# Patient Record
Sex: Male | Born: 1980
Health system: Southern US, Community
[De-identification: ages and names within clinical notes are randomized; demographics above are authoritative.]

## PROBLEM LIST (undated history)

## (undated) DIAGNOSIS — E162 Hypoglycemia, unspecified: Secondary | ICD-10-CM

## (undated) DIAGNOSIS — G8929 Other chronic pain: Secondary | ICD-10-CM

## (undated) HISTORY — PX: INTRAOCULAR LENS INSERTION: SHX110

## (undated) HISTORY — PX: RETINAL DETACHMENT SURGERY: SHX105

## (undated) HISTORY — PX: HERNIA REPAIR: SHX51

## (undated) HISTORY — PX: KNEE SURGERY: SHX244

---
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
## 2005-05-23 ENCOUNTER — Emergency Department (HOSPITAL_COMMUNITY): Admission: EM | Admit: 2005-05-23 | Discharge: 2005-05-23 | Payer: Self-pay | Admitting: Emergency Medicine

## 2005-05-24 ENCOUNTER — Inpatient Hospital Stay (HOSPITAL_COMMUNITY): Admission: EM | Admit: 2005-05-24 | Discharge: 2005-05-25 | Payer: Self-pay | Admitting: Emergency Medicine

## 2005-08-26 ENCOUNTER — Emergency Department (HOSPITAL_COMMUNITY): Admission: EM | Admit: 2005-08-26 | Discharge: 2005-08-26 | Payer: Self-pay | Admitting: Emergency Medicine

## 2006-02-08 ENCOUNTER — Emergency Department: Payer: Self-pay | Admitting: Emergency Medicine

## 2006-12-23 ENCOUNTER — Emergency Department (HOSPITAL_COMMUNITY): Admission: EM | Admit: 2006-12-23 | Discharge: 2006-12-23 | Payer: Self-pay | Admitting: Emergency Medicine

## 2007-04-05 ENCOUNTER — Emergency Department (HOSPITAL_COMMUNITY): Admission: EM | Admit: 2007-04-05 | Discharge: 2007-04-05 | Payer: Self-pay | Admitting: Emergency Medicine

## 2009-03-01 ENCOUNTER — Emergency Department (HOSPITAL_COMMUNITY): Admission: EM | Admit: 2009-03-01 | Discharge: 2009-03-02 | Payer: Self-pay | Admitting: Emergency Medicine

## 2009-05-05 ENCOUNTER — Emergency Department (HOSPITAL_COMMUNITY): Admission: EM | Admit: 2009-05-05 | Discharge: 2009-05-05 | Payer: Self-pay | Admitting: Emergency Medicine

## 2009-05-06 ENCOUNTER — Emergency Department (HOSPITAL_COMMUNITY): Admission: EM | Admit: 2009-05-06 | Discharge: 2009-05-06 | Payer: Self-pay | Admitting: Emergency Medicine

## 2009-05-06 ENCOUNTER — Emergency Department (HOSPITAL_COMMUNITY): Admission: EM | Admit: 2009-05-06 | Discharge: 2009-05-07 | Payer: Self-pay | Admitting: Emergency Medicine

## 2009-06-09 ENCOUNTER — Telehealth: Payer: Self-pay | Admitting: Internal Medicine

## 2010-02-09 NOTE — Progress Notes (Signed)
Summary: nos appt  Phone Note Call from Patient   Caller: [email or web address]  Call For: Cheng Dec Summary of Call: ATC to call pt to rsc nos from 5/27, phone numbers listed don't belong to him. Initial call taken by: Darletta Moll,  Jun 09, 2009 9:37 AM
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
## 2010-06-08 ENCOUNTER — Emergency Department (HOSPITAL_COMMUNITY): Payer: No Typology Code available for payment source

## 2010-06-08 ENCOUNTER — Emergency Department (HOSPITAL_COMMUNITY)
Admission: EM | Admit: 2010-06-08 | Discharge: 2010-06-08 | Disposition: A | Discharge status: Home / Self Care | Payer: No Typology Code available for payment source | Attending: Emergency Medicine | Admitting: Emergency Medicine

## 2010-06-08 DIAGNOSIS — R51 Headache: Secondary | ICD-10-CM | POA: Insufficient documentation

## 2010-06-08 DIAGNOSIS — R1012 Left upper quadrant pain: Secondary | ICD-10-CM | POA: Insufficient documentation

## 2010-06-08 DIAGNOSIS — M542 Cervicalgia: Secondary | ICD-10-CM | POA: Insufficient documentation

## 2010-06-08 DIAGNOSIS — M545 Low back pain, unspecified: Secondary | ICD-10-CM | POA: Insufficient documentation

## 2010-06-08 DIAGNOSIS — R413 Other amnesia: Secondary | ICD-10-CM | POA: Insufficient documentation

## 2010-06-08 DIAGNOSIS — M25529 Pain in unspecified elbow: Secondary | ICD-10-CM | POA: Insufficient documentation

## 2010-06-08 DIAGNOSIS — F29 Unspecified psychosis not due to a substance or known physiological condition: Secondary | ICD-10-CM | POA: Insufficient documentation

## 2010-06-08 DIAGNOSIS — M25569 Pain in unspecified knee: Secondary | ICD-10-CM | POA: Insufficient documentation

## 2010-06-08 LAB — POCT I-STAT, CHEM 8
Calcium, Ion: 1.2 mmol/L (ref 1.12–1.32)
Chloride: 103 mEq/L (ref 96–112)
Creatinine, Ser: 1.1 mg/dL (ref 0.4–1.5)
Potassium: 3.7 mEq/L (ref 3.5–5.1)
TCO2: 24 mmol/L (ref 0–100)

## 2010-06-08 MED ORDER — IOHEXOL 300 MG/ML  SOLN
100.0000 mL | Freq: Once | INTRAMUSCULAR | Status: AC | PRN
  Administered 2010-06-08: 100 mL via INTRAVENOUS

## 2010-07-01 ENCOUNTER — Emergency Department (HOSPITAL_COMMUNITY)
Admission: EM | Admit: 2010-07-01 | Discharge: 2010-07-01 | Discharge status: Against Medical Advice | Payer: Self-pay | Attending: Emergency Medicine | Admitting: Emergency Medicine

## 2010-07-01 ENCOUNTER — Emergency Department (HOSPITAL_COMMUNITY)
Admission: EM | Admit: 2010-07-01 | Discharge: 2010-07-01 | Disposition: A | Discharge status: Home / Self Care | Payer: No Typology Code available for payment source | Attending: Emergency Medicine | Admitting: Emergency Medicine

## 2010-07-01 DIAGNOSIS — S0990XA Unspecified injury of head, initial encounter: Secondary | ICD-10-CM | POA: Insufficient documentation

## 2010-07-01 DIAGNOSIS — Z049 Encounter for examination and observation for unspecified reason: Secondary | ICD-10-CM | POA: Insufficient documentation

## 2010-07-01 DIAGNOSIS — Z0389 Encounter for observation for other suspected diseases and conditions ruled out: Secondary | ICD-10-CM | POA: Insufficient documentation

## 2010-07-01 DIAGNOSIS — R51 Headache: Secondary | ICD-10-CM | POA: Insufficient documentation

## 2011-11-08 ENCOUNTER — Encounter (HOSPITAL_COMMUNITY): Payer: Self-pay | Admitting: *Deleted

## 2011-11-08 ENCOUNTER — Emergency Department (HOSPITAL_COMMUNITY)
Admission: EM | Admit: 2011-11-08 | Discharge: 2011-11-08 | Disposition: A | Discharge status: Home / Self Care | Payer: Self-pay | Attending: Emergency Medicine | Admitting: Emergency Medicine

## 2011-11-08 DIAGNOSIS — F17200 Nicotine dependence, unspecified, uncomplicated: Secondary | ICD-10-CM | POA: Insufficient documentation

## 2011-11-08 DIAGNOSIS — L0390 Cellulitis, unspecified: Secondary | ICD-10-CM

## 2011-11-08 DIAGNOSIS — L02219 Cutaneous abscess of trunk, unspecified: Secondary | ICD-10-CM | POA: Insufficient documentation

## 2011-11-08 MED ORDER — ACETAMINOPHEN-CODEINE #3 300-30 MG PO TABS: 1.0000 | ORAL_TABLET | Freq: Four times a day (QID) | ORAL | Status: DC | PRN

## 2011-11-08 MED ORDER — CLINDAMYCIN HCL 300 MG PO CAPS: 300.0000 mg | ORAL_CAPSULE | Freq: Four times a day (QID) | ORAL | Status: DC

## 2011-11-08 NOTE — ED Notes (Signed)
PA at bedside.

## 2011-11-08 NOTE — ED Notes (Signed)
Pt states went to [facility] on Saturday and had an abscess on abdomen drained, they told him if it didn't get any better then to come back to the hospital, states was given vicodin and antibiotic but has not helped. Pt states still very painful. Pt states has been breaking out in hives from the antibiotic.

## 2011-11-08 NOTE — ED Provider Notes (Signed)
History   This chart was scribed for non-physician practitioner working with Lyanne Co, MD by Greer Ee. This patient was seen in room WTR5/WTR5 and the patient's care was started at 16:34.    CSN: [id number]  Arrival date & time 11/08/11  1427   First MD Initiated Contact with Patient 11/08/11 1615      Chief Complaint  Patient presents with  . Abscess    (Consider location/radiation/quality/duration/timing/severity/associated sxs/prior treatment) The history is provided by the patient. No language interpreter was used.   Robert Quinn is a 31 y.o. male who presents to the Emergency Department complaining of a painful abscess that was drained 2 days ago at [facility] and per pt has gotten larger and been draining since it was drained.  Pain not improved by vicodin.  Pt reports he has been taking prescribed unspecified antibiotic on schedule, but that they have been causing hives resolved by benadryl.  Pt denies fever, chills, nausea, emesis, and abdominal pain as associated.  Pt has no h/o chronic medical conditions.  Pt is a current everyday smoker but denies alcohol use.  History reviewed. No pertinent past medical history.  Past Surgical History  Procedure Date  . Knee surgery     History reviewed. No pertinent family history.  History  Substance Use Topics  . Smoking status: Current Every Day Smoker  . Smokeless tobacco: Never Used  . Alcohol Use: No      Review of Systems  All other systems reviewed and are negative.    Allergies  Review of patient's allergies indicates no known allergies.  Home Medications   Current Outpatient Rx  Name Route Sig Dispense Refill  . CIPROFLOXACIN HCL 250 MG PO TABS Oral Take 250 mg by mouth 2 (two) times daily.    Marland Kitchen DIPHENHYDRAMINE HCL 25 MG PO TABS Oral Take 25 mg by mouth every 6 (six) hours as needed. Hives    . HYDROCODONE-ACETAMINOPHEN 5-500 MG PO CAPS Oral Take 1 capsule by mouth every 6 (six) hours as  needed. Pain      BP 123/56  Pulse 80  Temp 98.2 F (36.8 C) (Oral)  Resp 18  SpO2 100%  Physical Exam  Nursing note and vitals reviewed. Constitutional: He is oriented to person, place, and time. He appears well-developed and well-nourished.  HENT:  Head: Normocephalic and atraumatic.  Eyes: EOM are normal.  Neck: Normal range of motion. No tracheal deviation present.  Cardiovascular: Normal rate, regular rhythm and normal heart sounds.   No murmur heard. Pulmonary/Chest: Effort normal and breath sounds normal. He has no wheezes.  Musculoskeletal: Normal range of motion. He exhibits no edema.  Neurological: He is alert and oriented to person, place, and time.  Skin: Skin is warm.       1 inch indurated non-draining abscess with surrounding erythema and edema, very tender to palpation.  Psychiatric: He has a normal mood and affect.    ED Course  Procedures (including critical care time) INCISION AND DRAINAGE Performed by: Johnnette Gourd Consent: Verbal consent obtained. Risks and benefits: risks, benefits and alternatives were discussed Type: abscess  Body area: left suprapubic region  Anesthesia: local infiltration  Local anesthetic: lidocaine 2% with epinephrine  Anesthetic total: 5 ml  Complexity: complex Blunt dissection to break up loculations  Drainage: purulent  Drainage amount: large  Packing material: none  Patient tolerance: Patient tolerated the procedure well with no immediate complications.    DIAGNOSTIC STUDIES: Oxygen Saturation is 100% on room  air, normal by my interpretation.    COORDINATION OF CARE: 16:39- Patient informed of clinical course, understands medical decision-making process, and agrees with plan.  Discussed I&D.      Labs Reviewed - No data to display No results found.   1. Abscess and cellulitis       MDM  Abscess drained expressing purulent pus. There is surrounding erythema and edema. Patient believes the  antibiotic he was placed on with Bactrim. I will switch him to clindamycin. No packing needed. Close return precautions discussed.  I personally performed the services described in this documentation, which was scribed in my presence. The recorded information has been reviewed and considered. No att. providers found        Trevor Mace, PA-C 11/08/11 1734

## 2011-11-09 NOTE — ED Provider Notes (Signed)
Medical screening examination/treatment/procedure(s) were performed by non-physician practitioner and as supervising physician I was immediately available for consultation/collaboration.   Shane Melby M Ladina Shutters, MD 11/09/11 0018 

## 2012-07-21 ENCOUNTER — Emergency Department (HOSPITAL_COMMUNITY): Payer: Self-pay

## 2012-07-21 ENCOUNTER — Emergency Department (HOSPITAL_COMMUNITY)
Admission: EM | Admit: 2012-07-21 | Discharge: 2012-07-21 | Disposition: A | Discharge status: Home / Self Care | Payer: Self-pay | Attending: Emergency Medicine | Admitting: Emergency Medicine

## 2012-07-21 DIAGNOSIS — F17200 Nicotine dependence, unspecified, uncomplicated: Secondary | ICD-10-CM | POA: Insufficient documentation

## 2012-07-21 DIAGNOSIS — R0789 Other chest pain: Secondary | ICD-10-CM | POA: Insufficient documentation

## 2012-07-21 DIAGNOSIS — R062 Wheezing: Secondary | ICD-10-CM | POA: Insufficient documentation

## 2012-07-21 DIAGNOSIS — R079 Chest pain, unspecified: Secondary | ICD-10-CM

## 2012-07-21 DIAGNOSIS — R42 Dizziness and giddiness: Secondary | ICD-10-CM | POA: Insufficient documentation

## 2012-07-21 DIAGNOSIS — R55 Syncope and collapse: Secondary | ICD-10-CM | POA: Insufficient documentation

## 2012-07-21 LAB — POCT I-STAT TROPONIN I: Troponin i, poc: 0 ng/mL (ref 0.00–0.08)

## 2012-07-21 LAB — PRO B NATRIURETIC PEPTIDE: Pro B Natriuretic peptide (BNP): 43.7 pg/mL (ref 0–125)

## 2012-07-21 LAB — BASIC METABOLIC PANEL
Calcium: 9.4 mg/dL (ref 8.4–10.5)
Creatinine, Ser: 1.02 mg/dL (ref 0.50–1.35)
GFR calc non Af Amer: >90 mL/min (ref >90)
Glucose, Bld: 102 mg/dL — ABNORMAL HIGH (ref 70–99)
Sodium: 135 mEq/L (ref 135–145)

## 2012-07-21 LAB — CBC
MCH: 32.2 pg (ref 26.0–34.0)
MCHC: 35.8 g/dL (ref 30.0–36.0)
Platelets: 246 10*3/uL (ref 150–400)

## 2012-07-21 MED ORDER — ALBUTEROL SULFATE HFA 108 (90 BASE) MCG/ACT IN AERS
2.0000 | INHALATION_SPRAY | Freq: Once | RESPIRATORY_TRACT | Status: AC
  Administered 2012-07-21: 2 via RESPIRATORY_TRACT
  Filled 2012-07-21: qty 6.7

## 2012-07-21 MED ORDER — PREDNISONE 20 MG PO TABS
60.0000 mg | ORAL_TABLET | Freq: Once | ORAL | Status: AC
  Administered 2012-07-21: 60 mg via ORAL
  Filled 2012-07-21: qty 3

## 2012-07-21 MED ORDER — AEROCHAMBER PLUS W/MASK MISC
Status: AC
  Administered 2012-07-21: 23:00:00
  Filled 2012-07-21: qty 1

## 2012-07-21 MED ORDER — PREDNISONE 20 MG PO TABS: ORAL_TABLET | ORAL | Status: DC

## 2012-07-21 MED ORDER — IBUPROFEN 600 MG PO TABS: 600.0000 mg | ORAL_TABLET | Freq: Four times a day (QID) | ORAL | Status: DC | PRN

## 2012-07-21 NOTE — ED Notes (Signed)
Patient to xray.

## 2012-07-21 NOTE — ED Provider Notes (Signed)
I evaluated the patient in conjunction with the resident physician.  I saw the relevant studies, including the ecg (as needed) and agree with the interpretation. The documentation is accurate with the following additional / clarifications:  Darivs Lunden, MD 07/21/12 2337 

## 2012-07-21 NOTE — ED Notes (Signed)
Pt c/o Substernal and right sided CP described as sharp pain, with lightheadedness, dizziness, SOB, weakness, diaphoresis, and nausea. Denies vomiting. Pain exacerbated by nothing, Pain relieved by nothing. No ASA today.

## 2012-07-21 NOTE — ED Provider Notes (Signed)
History    CSN: [id number] Arrival date & time 07/21/12  2018  First MD Initiated Contact with Patient 07/21/12 2142     Chief Complaint  Patient presents with  . Chest Pain   (Consider location/radiation/quality/duration/timing/severity/associated sxs/prior Treatment) HPI Comments: Pt w/ no PMHx now w/ chest pain. States 3 wk hx of intermittent sharp chest pain, initially right sided, now left sided. Not exertional or pleuritic. No trauma, nttp, no recent fever, cough or infection. No hx of CAD. + hx of tobacco and family hx of CAD. Denies cocaine. Pain is sharp - sporadic - lasting several seconds to minutes and relieved w/ time. A/w dizziness and near syncope at onset of pain. Denies dyspnea, nausea or diaphoresis. Admits to significant stress in life w/ ex wife. No HA, blurred vision or diplopia   Patient is a 32 y.o. male presenting with general illness. The history is provided by the patient. No language interpreter was used.  Illness Location:  Cardio/pulm Quality:  Chest pain Severity:  Severe Onset quality:  Sudden Duration:  3 weeks Timing:  Intermittent Progression:  Worsening Chronicity:  New Associated symptoms: chest pain   Associated symptoms: no abdominal pain, no congestion, no cough, no diarrhea, no fever, no headaches, no nausea, no rash, no shortness of breath, no sore throat and no vomiting    No past medical history on file. Past Surgical History  Procedure Laterality Date  . Knee surgery     No family history on file. History  Substance Use Topics  . Smoking status: Current Every Day Smoker  . Smokeless tobacco: Never Used  . Alcohol Use: No    Review of Systems  Constitutional: Negative for fever and chills.  HENT: Negative for congestion and sore throat.   Respiratory: Negative for cough and shortness of breath.   Cardiovascular: Positive for chest pain. Negative for leg swelling.  Gastrointestinal: Negative for nausea, vomiting, abdominal pain,  diarrhea and constipation.  Genitourinary: Negative for dysuria and frequency.  Skin: Negative for color change and rash.  Neurological: Negative for dizziness and headaches.  Psychiatric/Behavioral: Negative for confusion and agitation.  All other systems reviewed and are negative.    Allergies  Review of patient's allergies indicates no known allergies.  Home Medications   Current Outpatient Rx  Name  Route  Sig  Dispense  Refill  . aspirin 325 MG tablet   Oral   Take 325 mg by mouth daily as needed for pain.          BP 107/62  Pulse 72  Temp(Src) 98 F (36.7 C) (Oral)  Resp 16  SpO2 97% Physical Exam  Constitutional: He is oriented to person, place, and time. He appears well-developed and well-nourished. No distress.  HENT:  Head: Normocephalic and atraumatic.  Eyes: EOM are normal. Pupils are equal, round, and reactive to light.  Neck: Normal range of motion. Neck supple.  Cardiovascular: Normal rate and regular rhythm.   Pulses:      Radial pulses are 2+ on the right side, and 2+ on the left side.  Pulmonary/Chest: Effort normal. No respiratory distress. He has no decreased breath sounds. He has wheezes in the left lower field. He has no rhonchi. He has no rales.  Abdominal: Soft. He exhibits no distension.  Musculoskeletal: Normal range of motion. He exhibits no edema.  Neurological: He is alert and oriented to person, place, and time.  Skin: Skin is warm and dry.  Psychiatric: He has a normal mood and  affect. His behavior is normal.    ED Course  Procedures (including critical care time) Results for orders placed during the hospital encounter of 07/21/12  CBC      Result Value Range   WBC 9.8  4.0 - 10.5 K/uL   RBC 5.09  4.22 - 5.81 MIL/uL   Hemoglobin 16.4  13.0 - 17.0 g/dL   HCT 16.1  09.6 - 04.5 %   MCV 90.0  78.0 - 100.0 fL   MCH 32.2  26.0 - 34.0 pg   MCHC 35.8  30.0 - 36.0 g/dL   RDW 40.9  81.1 - 91.4 %   Platelets 246  150 - 400 K/uL  BASIC  METABOLIC PANEL      Result Value Range   Sodium 135  135 - 145 mEq/L   Potassium 4.1  3.5 - 5.1 mEq/L   Chloride 101  96 - 112 mEq/L   CO2 28  19 - 32 mEq/L   Glucose, Bld 102 (*) 70 - 99 mg/dL   BUN 11  6 - 23 mg/dL   Creatinine, Ser 7.82  0.50 - 1.35 mg/dL   Calcium 9.4  8.4 - 95.6 mg/dL   GFR calc non Af Amer >90  >90 mL/min   GFR calc Af Amer >90  >90 mL/min  PRO B NATRIURETIC PEPTIDE      Result Value Range   Pro B Natriuretic peptide (BNP) 43.7  0 - 125 pg/mL  POCT I-STAT TROPONIN I      Result Value Range   Troponin i, poc 0.00  0.00 - 0.08 ng/mL   Comment 3            DG Chest 2 View (Final result)  Result time: 07/21/12 22:14:33    Final result by Rad Results In Interface (07/21/12 22:14:33)    Narrative:   *RADIOLOGY REPORT*  Clinical Data: Shortness of breath and chest pain for 1 week. Smoker.  CHEST - 2 VIEW  Comparison: 06/08/2010  Findings: The heart size and pulmonary vascularity are normal. The lungs appear clear and expanded without focal air space disease or consolidation. No blunting of the costophrenic angles. No pneumothorax. Mediastinal contours appear intact. No significant change since previous study.  IMPRESSION: No evidence of active pulmonary disease.   Original Report Authenticated By: Burman Nieves, M.D.            Date: 07/21/2012  Rate: 89  Rhythm: normal sinus rhythm  QRS Axis: normal  Intervals: normal  ST/T Wave abnormalities: normal  Conduction Disutrbances:none  Narrative Interpretation:   Old EKG Reviewed: none available   No diagnosis found.  MDM  Exam as above, only significant for minimal exp wheeze at left base. No resp distress or hypoxia or tachycardia. Chest wall nttp. ECG - no acute ischemia, troponin and BNP neg - doubt ACS. CXR - NACPF - no infiltrate. CBC, BMP. Pt admits to significant stress in life. Pain likely 2/2 stress vs inflammatory pleurisy. Doubt PE, dissection, tamponade, ACS. Pt is low risk.  Given albuterol MDI and prednisone 60mg  in ED. Stable for d/c home. Well appearing- denies pain at this time. Given rx for prednisone burst and motrin 600mg  TID. D/c in good condition. fup w/ wellness center if pain continues. Given return precautions.  I have personally reviewed labs and imaging and considered in my MDM. Case d/w Dr Jeraldine Loots  1. Chest pain   2. Wheezing    New Prescriptions   IBUPROFEN (ADVIL,MOTRIN) 600 MG TABLET  Take 1 tablet (600 mg total) by mouth every 6 (six) hours as needed for pain.   PREDNISONE (DELTASONE) 20 MG TABLET    3 tabs po day one, then 2 po daily x 4 days   [facility]     [address] [place] [address]   As needed if symptoms worsen    Audelia Hives, MD 07/21/12 2320

## 2012-07-27 ENCOUNTER — Encounter (HOSPITAL_COMMUNITY): Payer: Self-pay | Admitting: Family Medicine

## 2012-07-27 ENCOUNTER — Emergency Department (HOSPITAL_COMMUNITY)
Admission: EM | Admit: 2012-07-27 | Discharge: 2012-07-27 | Disposition: A | Discharge status: Home / Self Care | Payer: Self-pay | Attending: Emergency Medicine | Admitting: Emergency Medicine

## 2012-07-27 ENCOUNTER — Emergency Department (HOSPITAL_COMMUNITY): Payer: Self-pay

## 2012-07-27 DIAGNOSIS — F17200 Nicotine dependence, unspecified, uncomplicated: Secondary | ICD-10-CM | POA: Insufficient documentation

## 2012-07-27 DIAGNOSIS — R1110 Vomiting, unspecified: Secondary | ICD-10-CM | POA: Insufficient documentation

## 2012-07-27 DIAGNOSIS — R079 Chest pain, unspecified: Secondary | ICD-10-CM | POA: Insufficient documentation

## 2012-07-27 DIAGNOSIS — J40 Bronchitis, not specified as acute or chronic: Secondary | ICD-10-CM | POA: Insufficient documentation

## 2012-07-27 DIAGNOSIS — Z79899 Other long term (current) drug therapy: Secondary | ICD-10-CM | POA: Insufficient documentation

## 2012-07-27 DIAGNOSIS — Z7982 Long term (current) use of aspirin: Secondary | ICD-10-CM | POA: Insufficient documentation

## 2012-07-27 MED ORDER — AZITHROMYCIN 250 MG PO TABS
500.0000 mg | ORAL_TABLET | Freq: Once | ORAL | Status: AC
  Administered 2012-07-27: 500 mg via ORAL
  Filled 2012-07-27: qty 2

## 2012-07-27 MED ORDER — IPRATROPIUM BROMIDE 0.02 % IN SOLN
0.5000 mg | Freq: Once | RESPIRATORY_TRACT | Status: AC
  Administered 2012-07-27: 0.5 mg via RESPIRATORY_TRACT
  Filled 2012-07-27: qty 2.5

## 2012-07-27 MED ORDER — ALBUTEROL SULFATE HFA 108 (90 BASE) MCG/ACT IN AERS
2.0000 | INHALATION_SPRAY | Freq: Once | RESPIRATORY_TRACT | Status: AC
  Administered 2012-07-27: 2 via RESPIRATORY_TRACT
  Filled 2012-07-27: qty 6.7

## 2012-07-27 MED ORDER — AZITHROMYCIN 250 MG PO TABS: ORAL_TABLET | ORAL | Status: DC

## 2012-07-27 MED ORDER — GUAIFENESIN-DM 100-10 MG/5ML PO SYRP: 5.0000 mL | ORAL_SOLUTION | Freq: Three times a day (TID) | ORAL | Status: DC | PRN

## 2012-07-27 MED ORDER — ALBUTEROL SULFATE (5 MG/ML) 0.5% IN NEBU
5.0000 mg | INHALATION_SOLUTION | Freq: Once | RESPIRATORY_TRACT | Status: AC
  Administered 2012-07-27: 5 mg via RESPIRATORY_TRACT
  Filled 2012-07-27: qty 1

## 2012-07-27 NOTE — ED Notes (Signed)
Per pt sts chest pain on the left side from coughing non stop. sts also sore throat. sts coughing up green phlem. sts last time he was here they gave him an inhaler but not better.

## 2012-07-27 NOTE — ED Notes (Signed)
Breathing tx complete.  Pt to x-ray via wheelchair

## 2012-07-27 NOTE — ED Provider Notes (Signed)
This chart was scribed for Robert Quinn, a non-physician practitioner working with Doug Sou, MD by Lewanda Rife, ED Scribe. This patient was seen in room TR10C/TR10C and the patient's care was started at 1912.     History    CSN: [id number] Arrival date & time 07/27/12  1840  First MD Initiated Contact with Patient 07/27/12 1903     Chief Complaint  Patient presents with  . Cough   (Consider location/radiation/quality/duration/timing/severity/associated sxs/prior Treatment) The history is provided by the patient.   HPI Comments: Robert Quinn is a 32 y.o. male who presents to the Emergency Department complaining of cough onset 5 days. Reports associated chest pain with coughing, and post-tussive emesis with green sputum. Denies associated fever. Denies aggravating or alleviating factors. Reports using prescribed albuterol with no relief of symptoms. Reports smoking cigarettes everyday.  History reviewed. No pertinent past medical history. Past Surgical History  Procedure Laterality Date  . Knee surgery     History reviewed. No pertinent family history. History  Substance Use Topics  . Smoking status: Current Every Day Smoker  . Smokeless tobacco: Never Used  . Alcohol Use: No    Review of Systems  Respiratory: Positive for cough.   Cardiovascular: Positive for chest pain.  All other systems reviewed and are negative.   A complete 10 system review of systems was obtained and all systems are negative except as noted in the HPI and PMH.    Allergies  Review of patient's allergies indicates no known allergies.  Home Medications   Current Outpatient Rx  Name  Route  Sig  Dispense  Refill  . albuterol (PROVENTIL HFA;VENTOLIN HFA) 108 (90 BASE) MCG/ACT inhaler   Inhalation   Inhale 2 puffs into the lungs every 6 (six) hours as needed for wheezing.         Marland Kitchen aspirin 325 MG tablet   Oral   Take 325 mg by mouth daily as needed for pain.           There were no vitals taken for this visit. Physical Exam  Nursing note and vitals reviewed. Constitutional: He is oriented to person, place, and time. He appears well-developed and well-nourished. No distress.  HENT:  Head: Normocephalic and atraumatic.  Eyes: EOM are normal.  Neck: Neck supple. No tracheal deviation present.  Cardiovascular: Normal rate.   Pulmonary/Chest: Effort normal. No respiratory distress. He has wheezes (expiratory and inspiratory ). He has rhonchi in the left lower field. He has no rales.  Musculoskeletal: Normal range of motion.  Neurological: He is alert and oriented to person, place, and time.  Skin: Skin is warm and dry.  Psychiatric: He has a normal mood and affect. His behavior is normal.    ED Course  Procedures (including critical care time) Medications  albuterol (PROVENTIL) (5 MG/ML) 0.5% nebulizer solution 5 mg (not administered)  ipratropium (ATROVENT) nebulizer solution 0.5 mg (not administered)    Labs Reviewed - No data to display Dg Chest 2 View  07/27/2012   *RADIOLOGY REPORT*  Clinical Data: Cough, chest pain  CHEST - 2 VIEW  Comparison: 07/21/2012  Findings: Lungs are clear. No pleural effusion or pneumothorax.  Cardiomediastinal silhouette is within normal limits.  Visualized osseous structures are within normal limits.  IMPRESSION: No evidence of acute cardiopulmonary disease.   Original Report Authenticated By: Charline Bills, M.D.   1. Bronchitis     MDM  PT with persistent cough for over a week now. Pain in left side of  the chest with coughing. Green sputum. Pt was seen here 6 days for the same. Given albuterol inhaler and prednisone pack. States not improving. No fever, chills. Pt is a smoker. Advised to stop. Pt wheezing on exam. Neb ordered. Improved with 1 neb. Pt's cxr here is clear. Will start on z-pack for possible pertussis vs bacterial bronchitis.    I personally performed the services described in this documentation,  which was scribed in my presence. The recorded information has been reviewed and is accurate.    Lottie Mussel, PA-C 07/27/12 2056

## 2012-07-28 NOTE — ED Provider Notes (Signed)
Medical screening examination/treatment/procedure(s) were performed by non-physician practitioner and as supervising physician I was immediately available for consultation/collaboration.  Mattthew Ziomek, MD 07/28/12 0201 

## 2012-08-07 ENCOUNTER — Encounter (HOSPITAL_COMMUNITY): Payer: Self-pay

## 2012-08-07 ENCOUNTER — Emergency Department (HOSPITAL_COMMUNITY)
Admission: EM | Admit: 2012-08-07 | Discharge: 2012-08-07 | Disposition: A | Discharge status: Home / Self Care | Payer: Self-pay | Attending: Emergency Medicine | Admitting: Emergency Medicine

## 2012-08-07 DIAGNOSIS — R5381 Other malaise: Secondary | ICD-10-CM | POA: Insufficient documentation

## 2012-08-07 DIAGNOSIS — Z862 Personal history of diseases of the blood and blood-forming organs and certain disorders involving the immune mechanism: Secondary | ICD-10-CM | POA: Insufficient documentation

## 2012-08-07 DIAGNOSIS — G8929 Other chronic pain: Secondary | ICD-10-CM | POA: Insufficient documentation

## 2012-08-07 DIAGNOSIS — Z8639 Personal history of other endocrine, nutritional and metabolic disease: Secondary | ICD-10-CM | POA: Insufficient documentation

## 2012-08-07 DIAGNOSIS — M6281 Muscle weakness (generalized): Secondary | ICD-10-CM | POA: Insufficient documentation

## 2012-08-07 DIAGNOSIS — Z79899 Other long term (current) drug therapy: Secondary | ICD-10-CM | POA: Insufficient documentation

## 2012-08-07 DIAGNOSIS — F17200 Nicotine dependence, unspecified, uncomplicated: Secondary | ICD-10-CM | POA: Insufficient documentation

## 2012-08-07 DIAGNOSIS — E860 Dehydration: Secondary | ICD-10-CM | POA: Insufficient documentation

## 2012-08-07 DIAGNOSIS — R5383 Other fatigue: Secondary | ICD-10-CM | POA: Insufficient documentation

## 2012-08-07 DIAGNOSIS — Z7982 Long term (current) use of aspirin: Secondary | ICD-10-CM | POA: Insufficient documentation

## 2012-08-07 DIAGNOSIS — R197 Diarrhea, unspecified: Secondary | ICD-10-CM | POA: Insufficient documentation

## 2012-08-07 HISTORY — DX: Hypoglycemia, unspecified: E16.2

## 2012-08-07 HISTORY — DX: Other chronic pain: G89.29

## 2012-08-07 MED ORDER — SODIUM CHLORIDE 0.9 % IV BOLUS (SEPSIS)
1000.0000 mL | Freq: Once | INTRAVENOUS | Status: AC
  Administered 2012-08-07: 1000 mL via INTRAVENOUS

## 2012-08-07 MED ORDER — LOPERAMIDE HCL 2 MG PO CAPS
4.0000 mg | ORAL_CAPSULE | Freq: Once | ORAL | Status: AC
  Administered 2012-08-07: 4 mg via ORAL
  Filled 2012-08-07: qty 2

## 2012-08-07 NOTE — ED Provider Notes (Signed)
CSN: [id number]     Arrival date & time 08/07/12  1046 History     First MD Initiated Contact with Patient 08/07/12 1104     Chief Complaint  Patient presents with  . Diarrhea  . Weakness   (Consider location/radiation/quality/duration/timing/severity/associated sxs/prior Treatment) HPI Comments: 32 yo male with no medical hx, smoker presents with diarrhea for one week, every 2 hrs while awake, no blood.  No GI issus in the past, no surgeries on abd, no recent antibiotics, travel or new foods.  Pt tolerating po.  No new exposures at work.  Nothing improves.  No sick contacts.  Patient is a 32 y.o. male presenting with diarrhea and weakness. The history is provided by the patient.  Diarrhea Associated symptoms: no abdominal pain, no chills, no fever, no headaches and no vomiting   Weakness Pertinent negatives include no chest pain, no abdominal pain, no headaches and no shortness of breath.    Past Medical History  Diagnosis Date  . Hypoglycemia   . Chronic pain    Past Surgical History  Procedure Laterality Date  . Knee surgery     No family history on file. History  Substance Use Topics  . Smoking status: Current Every Day Smoker  . Smokeless tobacco: Never Used  . Alcohol Use: No    Review of Systems  Constitutional: Positive for fatigue. Negative for fever and chills.  HENT: Negative for neck pain and neck stiffness.   Respiratory: Negative for shortness of breath.   Cardiovascular: Negative for chest pain.  Gastrointestinal: Positive for diarrhea. Negative for vomiting and abdominal pain.  Genitourinary: Negative for dysuria and flank pain.  Musculoskeletal: Negative for back pain.  Skin: Negative for rash.  Neurological: Positive for weakness. Negative for light-headedness and headaches.    Allergies  Review of patient's allergies indicates no known allergies.  Home Medications   Current Outpatient Rx  Name  Route  Sig  Dispense  Refill  . albuterol  (PROVENTIL HFA;VENTOLIN HFA) 108 (90 BASE) MCG/ACT inhaler   Inhalation   Inhale 2 puffs into the lungs every 6 (six) hours as needed for wheezing.         Marland Kitchen aspirin 325 MG tablet   Oral   Take 325 mg by mouth daily as needed for pain.         Marland Kitchen azithromycin (ZITHROMAX) 250 MG tablet      Take 1 tab PO QD   4 tablet   0   . guaiFENesin-dextromethorphan (ROBITUSSIN DM) 100-10 MG/5ML syrup   Oral   Take 5 mLs by mouth 3 (three) times daily as needed for cough.   118 mL   0    BP 117/72  Pulse 76  Temp(Src) 98.2 F (36.8 C)  Resp 20  SpO2 98% Physical Exam  Nursing note and vitals reviewed. Constitutional: He is oriented to person, place, and time. He appears well-developed and well-nourished.  HENT:  Head: Normocephalic and atraumatic.  Dry mm  Eyes: Conjunctivae are normal. Right eye exhibits no discharge. Left eye exhibits no discharge.  Neck: Normal range of motion. Neck supple. No tracheal deviation present.  Cardiovascular: Normal rate and regular rhythm.   Pulmonary/Chest: Effort normal and breath sounds normal.  Abdominal: Soft. He exhibits no distension. There is no tenderness. There is no guarding.  Musculoskeletal: He exhibits no edema.  Neurological: He is alert and oriented to person, place, and time.  Skin: Skin is warm. No rash noted.  Psychiatric: He has a  normal mood and affect.    ED Course   Procedures (including critical care time)  Labs Reviewed - No data to display No results found. No diagnosis found.  MDM  Well appearing.  No red flags.  likely viral.  Supportive care and fup. Fluid bolus in ED.  DC  Enid Skeens, MD 08/07/12 1558

## 2012-08-07 NOTE — ED Notes (Signed)
Pt c/o diarrhea x1wk, c/o weakness/dizziness now

## 2012-08-07 NOTE — Progress Notes (Signed)
[facility] provided patient with a [employer], list of primary care resources, and information about the [facility].

## 2012-09-06 ENCOUNTER — Emergency Department (HOSPITAL_COMMUNITY): Payer: Self-pay

## 2012-09-06 ENCOUNTER — Emergency Department (HOSPITAL_COMMUNITY)
Admission: EM | Admit: 2012-09-06 | Discharge: 2012-09-07 | Disposition: A | Discharge status: Home / Self Care | Payer: Self-pay | Attending: Emergency Medicine | Admitting: Emergency Medicine

## 2012-09-06 ENCOUNTER — Encounter (HOSPITAL_COMMUNITY): Payer: Self-pay | Admitting: Adult Health

## 2012-09-06 DIAGNOSIS — G8929 Other chronic pain: Secondary | ICD-10-CM | POA: Insufficient documentation

## 2012-09-06 DIAGNOSIS — W278XXA Contact with other nonpowered hand tool, initial encounter: Secondary | ICD-10-CM | POA: Insufficient documentation

## 2012-09-06 DIAGNOSIS — Y9389 Activity, other specified: Secondary | ICD-10-CM | POA: Insufficient documentation

## 2012-09-06 DIAGNOSIS — S61409A Unspecified open wound of unspecified hand, initial encounter: Secondary | ICD-10-CM | POA: Insufficient documentation

## 2012-09-06 DIAGNOSIS — Y9289 Other specified places as the place of occurrence of the external cause: Secondary | ICD-10-CM | POA: Insufficient documentation

## 2012-09-06 DIAGNOSIS — Z87828 Personal history of other (healed) physical injury and trauma: Secondary | ICD-10-CM | POA: Insufficient documentation

## 2012-09-06 DIAGNOSIS — Z8639 Personal history of other endocrine, nutritional and metabolic disease: Secondary | ICD-10-CM | POA: Insufficient documentation

## 2012-09-06 DIAGNOSIS — Z23 Encounter for immunization: Secondary | ICD-10-CM | POA: Insufficient documentation

## 2012-09-06 DIAGNOSIS — F17200 Nicotine dependence, unspecified, uncomplicated: Secondary | ICD-10-CM | POA: Insufficient documentation

## 2012-09-06 DIAGNOSIS — Z862 Personal history of diseases of the blood and blood-forming organs and certain disorders involving the immune mechanism: Secondary | ICD-10-CM | POA: Insufficient documentation

## 2012-09-06 DIAGNOSIS — T148XXA Other injury of unspecified body region, initial encounter: Secondary | ICD-10-CM

## 2012-09-06 MED ORDER — TETANUS-DIPHTH-ACELL PERTUSSIS 5-2.5-18.5 LF-MCG/0.5 IM SUSP
0.5000 mL | Freq: Once | INTRAMUSCULAR | Status: AC
  Administered 2012-09-06: 0.5 mL via INTRAMUSCULAR
  Filled 2012-09-06: qty 0.5

## 2012-09-06 MED ORDER — CEPHALEXIN 500 MG PO CAPS: 500.0000 mg | ORAL_CAPSULE | Freq: Four times a day (QID) | ORAL | Status: DC

## 2012-09-06 NOTE — ED Notes (Addendum)
Presents with puncture wound to left palm near thumb post screwdriver impalement. Able to move thumb, no deformity, reports shooting pain up left forearm.  Last tetanus over 10 years.

## 2012-09-06 NOTE — ED Provider Notes (Signed)
CSN: [id number]     Arrival date & time 09/06/12  2048 History  This chart was scribed for non-physician practitioner [name], PA-C working with [name], MD by [name], ED Scribe. This patient was seen in room TR09C/TR09C and the patient's care was started at 9:57 PM.    Chief Complaint  Patient presents with  . Hand Injury   The history is provided by the patient. No language interpreter was used.   HPI Comments: Robert Quinn is a 32 y.o. male who presents to the Emergency Department complaining of a puncture wound to the left palm post screwdriver impalement at 2pm this afternoon. Pt reports constant throbbing pain that radiates up to his left forearm. He experiences pain with ROM.  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 Pt denies weakness, numbness and tingling, CP, SOB, difficulty breathing, loss of sensation. Pt has not had a tetanus vaccine in 10 years.   Past Medical History  Diagnosis Date  . Hypoglycemia   . Chronic pain    Past Surgical History  Procedure Laterality Date  . Knee surgery     History reviewed. No pertinent family history. History  Substance Use Topics  . Smoking status: Current Every Day Smoker  . Smokeless tobacco: Never Used  . Alcohol Use: No    Review of Systems  Respiratory: Negative for shortness of breath.   Cardiovascular: Negative for chest pain.  Skin: Positive for wound (puncture wound to left palm).  Neurological: Negative for weakness and numbness.  All other systems reviewed and are negative.    Allergies  Review of patient's allergies indicates no known allergies.  Home Medications   Current Outpatient Rx  Name  Route  Sig  Dispense  Refill  . ibuprofen (ADVIL,MOTRIN) 200 MG tablet   Oral   Take 400 mg by mouth every 6 (six) hours as needed for pain.         . cephALEXin (KEFLEX) 500 MG capsule   Oral   Take 1 capsule (500 mg total) by mouth 4 (four) times daily.   40 capsule    0    BP 124/73  Pulse 92  Temp(Src) 97.9 F (36.6 C) (Oral)  Resp 14  SpO2 96% Physical Exam  Nursing note and vitals reviewed. Constitutional: He is oriented to person, place, and time. He appears well-developed and well-nourished. No distress.  HENT:  Head: Normocephalic and atraumatic.  Cardiovascular: Normal rate, regular rhythm and normal heart sounds.  Exam reveals no friction rub.   No murmur heard. Pulses:      Radial pulses are 2+ on the right side, and 2+ on the left side.  Pulmonary/Chest: Effort normal and breath sounds normal. No respiratory distress. He has no wheezes. He has no rales.  Musculoskeletal: Normal range of motion.  Full range of motion to the left hand and digits. Patient is able to make a fist. Full range of motion to the left thumb. Negative swelling, erythema, inflammation noted. Mild discomfort upon palpation to the plantar region of the left hand. Negative snuffbox tenderness. Mild discomfort upon palpation to the left forearm, flexor surface. Full range of motion to the left wrist. Full range of motion to left elbow.  Neurological: He is alert and oriented to person, place, and time. He exhibits normal muscle tone. Coordination normal.  Sensation intact with differentiation to sharp and dull touch to left upper extremity, left hand and digits Strength 5+/5+ with resistance,  equally distributed - mild discomfort noted with use of the left hand  Skin: Skin is warm. He is not diaphoretic.  Small puncture wound noted to the palmar aspect of the left hand, near base of left thumb. Bleeding controlled. Negative signs of drainage noted.  Psychiatric: He has a normal mood and affect. His behavior is normal. Thought content normal.    ED Course  Procedures (including critical care time)  Medications  TDaP (BOOSTRIX) injection 0.5 mL (0.5 mLs Intramuscular Given 09/06/12 2321)    DIAGNOSTIC STUDIES: Oxygen Saturation is 96% on room air, normal by my  interpretation.    COORDINATION OF CARE: 11:44 PM- Discussed treatment plan with pt which includes a tetanus shot, antibiotic treatment, and advising the patient to clean and dress the wound and pt agrees to plan.  Medications  TDaP (BOOSTRIX) injection 0.5 mL (0.5 mLs Intramuscular Given 09/06/12 2321)    Labs Review Labs Reviewed - No data to display Imaging Review Dg Hand Complete Left  09/06/2012   *RADIOLOGY REPORT*  Clinical Data: Screwdriver when into palm of left hand.  Pain.  LEFT HAND - COMPLETE 3+ VIEW  Comparison: None  Findings: No fracture.  The joints normally spaced and aligned.  No radiopaque foreign bodies.  IMPRESSION: No fracture or radiopaque foreign body   Original Report Authenticated By: Amie Portland, M.D.    MDM   1. Puncture wound    I personally performed the services described in this documentation, which was scribed in my presence. The recorded information has been reviewed and is accurate.  Patient presenting to the emergency department with a puncture wound to the base of the left thumb, palmar aspect, that occurred at 2:00 today while at work. Patient reported that he was using a screwdriver when he accidentally slipped and screwdriver landed into his hand. Patient reports that he has shooting pain from the left hand down into the left arm, reported that pain is exacerbated with motion. Patient reported that he has not had a tetanus shot within the past 10 years. Patient denied numbness, tingling, weakness, loss of sensation. Alert and oriented. Oil noted to hand in underneath fingernails bilaterally, patient is a Curator. Scabbed over small puncture wound noted to the left hand, base of the left thumb palmar aspect. Discomfort upon palpation to this area. Negative signs of swelling, erythema, inflammation, drainage of pus, bleeding. Negative signs of infection. Full range of motion to the left thumb and hand-patient is able to make a fist-but pain is noted with  motion. Intact. Sensation intact. Bleeding controlled. Pulses palpable. Negative neurological deficits noted. Imaging of left hand negative for radiopaque foreign bodies. Wound cleaned thoroughly. Bacitracin and gauze applied. Tetanus administered in ED setting. No sutures required. Wound is restarted healing process since it's been over 9 hours. Patient stable, afebrile. Negative neurological or vascular damage noted. Negative defects in motion, patient is able to move by pain with motion is noted. Discharged patient with antibiotics for coverage. Discussed with patient to rest and stay hydrated. Discussed with patient proper wound care. Discussed with patient to wear gloves all times especially when at work. Discussed with patient to [facility]up with [facility] to be reevaluated next week. Discussed with patient to continue to monitor symptoms and if symptoms are to worsen or change to report back to emergency department-strict return instructions given. Patient agreed to plan of care, understood, all questions answered.  [name], PA-C 09/07/12 [address], PA-C 09/07/12 1350

## 2012-09-07 NOTE — ED Notes (Signed)
Pt discharged.Vital signs stable and GCS 15 

## 2012-09-09 NOTE — ED Provider Notes (Signed)
Medical screening examination/treatment/procedure(s) were performed by non-physician practitioner and as supervising physician I was immediately available for consultation/collaboration.   Qiara Minetti H Nesanel Aguila, MD 09/09/12 2139 

## 2013-07-04 ENCOUNTER — Encounter (HOSPITAL_COMMUNITY): Payer: Self-pay | Admitting: Emergency Medicine

## 2013-07-04 DIAGNOSIS — T4995XA Adverse effect of unspecified topical agent, initial encounter: Secondary | ICD-10-CM | POA: Insufficient documentation

## 2013-07-04 DIAGNOSIS — F17200 Nicotine dependence, unspecified, uncomplicated: Secondary | ICD-10-CM | POA: Insufficient documentation

## 2013-07-04 DIAGNOSIS — L988 Other specified disorders of the skin and subcutaneous tissue: Secondary | ICD-10-CM | POA: Insufficient documentation

## 2013-07-04 NOTE — ED Notes (Signed)
Patient presents stating that he started out with an allergic reaction.  Has taken 4 benadryl at home.  Now he has few red areas but the whelps were gone

## 2013-07-05 ENCOUNTER — Emergency Department (HOSPITAL_COMMUNITY)
Admission: EM | Admit: 2013-07-05 | Discharge: 2013-07-05 | Discharge status: Against Medical Advice | Payer: Self-pay | Attending: Emergency Medicine | Admitting: Emergency Medicine

## 2013-07-05 NOTE — ED Notes (Signed)
Patient stated he is feeling better.  This nurse looked and did not see and whelps on his back or arms.

## 2013-08-26 ENCOUNTER — Emergency Department (HOSPITAL_COMMUNITY): Payer: Self-pay

## 2013-08-26 ENCOUNTER — Emergency Department (HOSPITAL_COMMUNITY)
Admission: EM | Admit: 2013-08-26 | Discharge: 2013-08-27 | Disposition: A | Discharge status: Home / Self Care | Payer: Self-pay | Attending: Emergency Medicine | Admitting: Emergency Medicine

## 2013-08-26 DIAGNOSIS — G8929 Other chronic pain: Secondary | ICD-10-CM | POA: Insufficient documentation

## 2013-08-26 DIAGNOSIS — R209 Unspecified disturbances of skin sensation: Secondary | ICD-10-CM | POA: Insufficient documentation

## 2013-08-26 DIAGNOSIS — F17200 Nicotine dependence, unspecified, uncomplicated: Secondary | ICD-10-CM

## 2013-08-26 DIAGNOSIS — K006 Disturbances in tooth eruption: Secondary | ICD-10-CM | POA: Insufficient documentation

## 2013-08-26 DIAGNOSIS — E876 Hypokalemia: Secondary | ICD-10-CM

## 2013-08-26 DIAGNOSIS — M549 Dorsalgia, unspecified: Secondary | ICD-10-CM | POA: Insufficient documentation

## 2013-08-26 DIAGNOSIS — R0602 Shortness of breath: Secondary | ICD-10-CM | POA: Insufficient documentation

## 2013-08-26 DIAGNOSIS — R079 Chest pain, unspecified: Secondary | ICD-10-CM

## 2013-08-26 DIAGNOSIS — F411 Generalized anxiety disorder: Secondary | ICD-10-CM | POA: Insufficient documentation

## 2013-08-26 LAB — CBC
HCT: 42.8 % (ref 39.0–52.0)
Hemoglobin: 15.1 g/dL (ref 13.0–17.0)
MCH: 31.4 pg (ref 26.0–34.0)
MCHC: 35.3 g/dL (ref 30.0–36.0)
MCV: 89 fL (ref 78.0–100.0)
Platelets: 223 10*3/uL (ref 150–400)
RBC: 4.81 MIL/uL (ref 4.22–5.81)
RDW: 12.3 % (ref 11.5–15.5)
WBC: 8.6 10*3/uL (ref 4.0–10.5)

## 2013-08-26 LAB — BASIC METABOLIC PANEL
ANION GAP: 11 (ref 5–15)
BUN: 10 mg/dL (ref 6–23)
CO2: 25 mEq/L (ref 19–32)
CREATININE: 0.95 mg/dL (ref 0.50–1.35)
Calcium: 9.2 mg/dL (ref 8.4–10.5)
Chloride: 105 mEq/L (ref 96–112)
Glucose, Bld: 116 mg/dL — ABNORMAL HIGH (ref 70–99)
Potassium: 3.6 mEq/L — ABNORMAL LOW (ref 3.7–5.3)
Sodium: 141 mEq/L (ref 137–147)

## 2013-08-26 LAB — I-STAT TROPONIN, ED: Troponin i, poc: 0 ng/mL (ref 0.00–0.08)

## 2013-08-26 MED ORDER — TRAMADOL HCL 50 MG PO TABS: 50.0000 mg | ORAL_TABLET | Freq: Four times a day (QID) | ORAL | Status: DC | PRN

## 2013-08-26 MED ORDER — GI COCKTAIL ~~LOC~~
30.0000 mL | Freq: Once | ORAL | Status: AC
  Administered 2013-08-26: 30 mL via ORAL
  Filled 2013-08-26: qty 30

## 2013-08-26 MED ORDER — MELOXICAM 7.5 MG PO TABS: 7.5000 mg | ORAL_TABLET | Freq: Every day | ORAL | Status: DC

## 2013-08-26 MED ORDER — ASPIRIN 81 MG PO CHEW
324.0000 mg | CHEWABLE_TABLET | Freq: Once | ORAL | Status: AC
  Administered 2013-08-26: 324 mg via ORAL
  Filled 2013-08-26: qty 4

## 2013-08-26 MED ORDER — KETOROLAC TROMETHAMINE 30 MG/ML IJ SOLN
30.0000 mg | Freq: Once | INTRAMUSCULAR | Status: AC
  Administered 2013-08-26: 30 mg via INTRAMUSCULAR
  Filled 2013-08-26: qty 1

## 2013-08-26 NOTE — Discharge Instructions (Signed)
Chest Pain (Nonspecific) °It is often hard to give a diagnosis for the cause of chest pain. There is always a chance that your pain could be related to something serious, such as a heart attack or a blood clot in the lungs. You need to follow up with your doctor. °HOME CARE °· If antibiotic medicine was given, take it as directed by your doctor. Finish the medicine even if you start to feel better. °· For the next few days, avoid activities that bring on chest pain. Continue physical activities as told by your doctor. °· Do not use any tobacco products. This includes cigarettes, chewing tobacco, and e-cigarettes. °· Avoid drinking alcohol. °· Only take medicine as told by your doctor. °· Follow your doctor's suggestions for more testing if your chest pain does not go away. °· Keep all doctor visits you made. °GET HELP IF: °· Your chest pain does not go away, even after treatment. °· You have a rash with blisters on your chest. °· You have a fever. °GET HELP RIGHT AWAY IF:  °· You have more pain or pain that spreads to your arm, neck, jaw, back, or belly (abdomen). °· You have shortness of breath. °· You cough more than usual or cough up blood. °· You have very bad back or belly pain. °· You feel sick to your stomach (nauseous) or throw up (vomit). °· You have very bad weakness. °· You pass out (faint). °· You have chills. °This is an emergency. Do not wait to see if the problems will go away. Call your local emergency services (911 in [place] Do not drive yourself to the hospital. °MAKE SURE YOU:  °· Understand these instructions. °· Will watch your condition. °· Will get help right away if you are not doing well or get worse. °Document Released: 06/15/2007 Document Revised: 01/01/2013 Document Reviewed: 06/15/2007 °ExitCare® Patient Information ©2015 [employer]. This information is not intended to replace advice given to you by your health care provider. Make sure you discuss any questions you have with your  health care provider. ° ° °Emergency Department Resource Guide °1) Find a Doctor and Pay Out of Pocket °Although you won't have to find out who is covered by your insurance plan, it is a good idea to ask around and get recommendations. You will then need to call the office and see if the doctor you have chosen will accept you as a new patient and what types of options they offer for patients who are self-pay. Some doctors offer discounts or will set up payment plans for their patients who do not have insurance, but you will need to ask so you aren't surprised when you get to your appointment. ° °2) Contact Your Local Health Department °Not all health departments have doctors that can see patients for sick visits, but many do, so it is worth a call to see if yours does. If you don't know where your local health department is, you can check in your phone book. The CDC also has a tool to help you locate your state's health department, and many state websites also have listings of all of their local health departments. ° °3) Find a Walk-in Clinic °If your illness is not likely to be very severe or complicated, you may want to try a walk in clinic. These are popping up all over the country in pharmacies, drugstores, and shopping centers. They're usually staffed by nurse practitioners or physician assistants that have been trained to treat common   illnesses and complaints. They're usually fairly quick and inexpensive. However, if you have serious medical issues or chronic medical problems, these are probably not your best option. ° °No Primary Care Doctor: °- Call [facility] at  832-8000 - they can help you locate a primary care doctor that  accepts your insurance, provides certain services, etc. °- Physician Referral Service- 1-800-533-3463 ° °Chronic Pain Problems: °Organization         Address  Phone   Notes  °[facility]  (336) 297-2271 Patients need to be referred by their primary care doctor.   ° °Medication Assistance: °Organization         Address  Phone   Notes  °[place] [facility] [address] °[place], [place] [address] (336) 641-8030 --Must be a resident of [place] °-- Must have NO insurance coverage whatsoever (no Medicaid/ Medicare, etc.) °-- The pt. MUST have a primary care doctor that directs their care regularly and follows them in the community °  °[facility]  (866) 331-1348   °[employer]  (888) 892-1162   ° °Agencies that provide inexpensive medical care: °Organization         Address  Phone   Notes  °[facility]  (336) 832-8035   °[facility]    (336) 832-7272   °[facility] [address] °[place], [place] [address] (336) 832-4777   °[facility] [address], °[place] (336) 271-4999   °[facility]    (336) 373-0678   °[facility]    (336) 272-1050   °[facility] ° [address], [place] Phone:  (336) 832-4444, Fax:  (336) 832-4440 Hours of Operation:  9 am - 6 pm, M-F.  Also accepts Medicaid/Medicare and self-pay.  ° [facility] ° [address], [place] Phone: (336) 832-3150, Fax: (336) 832-3151. Hours of Operation:  8:30 am - 5:30 pm, M-F.  Also accepts Medicaid and self-pay.  °[facility] [place] [address], [place] Phone: (336) 878-6027   °[facility] [address], [place], [place] (336)723-1848, Ext. 123 Mondays & Thursdays: 7-9 AM.  First 15 patients are seen on a first come, first serve basis. °  ° °Medicaid-accepting [place] Providers: ° °Organization         Address  Phone   Notes  °[facility] [address], [place] (336) 641-2100 Also accepts self-pay patients.  °[facility] [address], [place] ° (336) 856-9996   °[facility] [address], [place] (336) 288-8857   °[facility] [address] [place] [address], [place] (336) 299-7000   °Veita Bland [address], [place]  ° (336) 373-1557 Only accepts [place] Access Medicaid patients after they have their name applied to their card.  ° °Self-Pay (no insurance) in [place]: ° °Organization         Address  Phone   Notes  °Sickle Cell Patients, [facility] [address], [place] (336) 832-1970   °[facility] [address], [place] (336) 832-4400   °[facility] [place] ° [address],  (336) 992-4800   °[facility]/Dr. Osei-Bonsu ° [address] [place] [address], [place] or [address],   [place] (336) 841-8500 Phone number for both [place] and [place] locations is the same.  °[facility] [address], [place] (336) 299-0000   °[facility] [address] [place] [address], [place] or [address] (336) 852-7530 °(336) 878-2260   °[facility] [address], [place] (336) 350-1642, phone; (336) 294-5005, fax Sees patients 1st and 3rd Saturday of every month.  Must not qualify for public or private insurance (i.e. Medicaid, Medicare, [place] Health Choice, Veterans' Benefits) • Household income should be no more than 200% of the poverty level •The clinic cannot treat you if you are pregnant or think you are pregnant • Sexually transmitted diseases are not treated at the clinic.  ° ° °Dental Care: °Organization         Address  Phone  Notes  °[facility] [address], [place] (336) 641-6152 Accepts children up to age 21 who are enrolled in Medicaid or [place] Health Choice; pregnant women with a Medicaid card; and children who have applied for Medicaid or [place] Health Choice, but were declined, whose parents can pay a reduced fee at time of service.    °[facility] [place]  [address], [place] (336) 641-7733 Accepts children up to age 21 who are enrolled in Medicaid or [place] Health Choice; pregnant women with a Medicaid card; and children who have applied for Medicaid or [place] Health Choice, but were declined, whose parents can pay a reduced fee at time of service.  °[facility] ° [address], [place] (336) 641-4533 Patients are seen by appointment only. Walk-ins are not accepted. [facility] will see patients 18 years of age and older. °Monday - Tuesday (8am-5pm) °Most Wednesdays (8:30-5pm) °$30 per visit, cash only  °[facility] ° [address], [place] (336) 641-4533 Patients are seen by appointment only. Walk-ins are not accepted. [facility] will see patients 18 years of age and older. °One Wednesday Evening (Monthly: Volunteer Based).  $30 per visit, cash only  °[facility]  (919) 537-3737 for adults; Children under age 4, call Graduate Pediatric Dentistry at (919) [phone]. Children aged 4-14, please call (919) [phone] to request a pediatric application. ° Dental services are provided in all areas of dental care including fillings, crowns and bridges, complete and partial dentures, implants, gum treatment, root canals, and extractions. Preventive care is also provided. Treatment is provided to both adults and children. °Patients are selected via a lottery and there is often a waiting list. °  °[facility] [address], ° ° (336) 763-8833 [email or web address] °  °[facility] [address], [place], [place] (336)723-1848, Ext. 123 Second and Fourth Thursday of each month, opens at 6:30 AM; Clinic ends at 9 AM.  Patients are seen on a first-come first-served basis, and a limited number are seen during each clinic.  ° °[facility] ° [address], [place], [place] (336)  723-7904   Eligibility Requirements °You must have lived in [place], [place], or [place] counties for at least the last three months. °  You cannot be eligible for state or federal sponsored healthcare insurance, including Veterans Administration, Medicaid, or Medicare. °  You generally cannot be eligible for healthcare insurance through your employer.  °  How to apply: °Eligibility screenings are held every Tuesday   and Wednesday afternoon from 1:00 pm until 4:00 pm. You do not need an appointment for the interview!  °[facility] [address], [place], [place] 336-631-2330   °[facility]  336-342-8273   °[facility]  336-703-3100   °[facility]  336-570-6415   ° °Behavioral Health Resources in the Community: °Intensive Outpatient Programs °Organization         Address  Phone  Notes  °[facility] [address], [place], [place] 336-878-6098   °[facility] [address], [place], [place] 336-832-9800   °[facility] [address], [place], [place] ° 336-882-2125   °[facility] [address],  °[place], [place] 1-800-853-5163 or 336-641-4981   °Substance Abuse Resources °Organization         Address  Phone  Notes  °[facility]  336-882-2125   °[facility]  336-784-9470   °[facility]  336-285-9073   °[facility]  336-845-3988   °Residential & Outpatient Substance Abuse Program  1-800-659-3381   °Psychological Services °Organization         Address  Phone  Notes  °[facility]  336- 832-9600   °[facility]  336- 378-7881   °[facility] [address], [place] 1-800-853-5163 or 336-641-4981   ° °Mobile Crisis Teams °Organization         Address  Phone  Notes  °[facility], Mobile Crisis Care Unit  1-877-626-1772   °Assertive °[facility] ° [address] [place], [place] 336-834-9664   °Sharon DeEsch [address] °[place] 336-554-5454   ° °Self-Help/Support Groups °Organization         Address  Phone             Notes  °[facility] - variety of support groups  336- 373-1402 Call for more information  °Narcotics Anonymous (NA), [facility] [address], °[place]  2 meetings at this location  ° °Residential Treatment Programs °Organization         Address  Phone  Notes  °[facility] [address],    °[place]  1-866-801-8205   °[facility] ° [address], [place], [place] 704-293-8524   °[facility] [address], [place] 336-845-3988 Admissions: 8am-3pm M-F  °[facility] [address]    °[place], [place] 336-841-1104   °[facility] [address], [place], [place] 336-379-7146   °[facility] [address]  °[place], [place] 336-285-9073   °[facility] - Intensive Outpatient [address], [place], [place] 336-852-3033   °[facility] [address]  °[place], [place] 1-877-615-2722 or 336-784-9470   °[facility] [address] [place], [place] 336-227-7417 Accepts Medicaid  °[facility] [address]  °[place] 1-800-659-3381 Substance Abuse/Addiction Treatment  ° °[place] Behavioral Health Resources °Organization         Address  Phone  Notes  °[facility]  (888) 581-9988   °Julie Brannon, PhD [address] [place], [place]   (336) 349-5553 or (336) 951-0000   °[facility]   [address] °[place], [place] (336) 349-4454   °[facility] [address], [place], [place] (336) 342-8316 Insurance/Medicaid/sponsorship through [facility]  °[facility] [address]                                    [place], [place] (336) 342-8316  Therapy/tele-psych/case  °[facility] [address]  ° [place], [place] (336) 349-2233    °Dr. Arfeen  (336) 349-4544   °[facility] [place]  [employer] [place] [facility] 1) [address], [place] °2) [address], [place] °3)  [address], [place] (336) 349-3220 °(336) 342-7768 ° °(336) 342-8140   °[place] [facility] (336) 342-1394 or (336) 342-3537 (After Hours)    ° ° ° °

## 2013-08-26 NOTE — ED Notes (Signed)
Patient here with complaint of chest pain which began today around 1500. States that he hasn't had chest pain before today. Endorses shortness of breath, dizziness, feeling faint, weakness, back pain, bilateral arm pain, and neck pain.

## 2013-08-26 NOTE — ED Provider Notes (Signed)
CSN: [id number]     Arrival date & time 08/26/13  2132 History   First MD Initiated Contact with Patient 08/26/13 2213     Chief Complaint  Patient presents with  . Chest Pain  . Shortness of Breath   Patient is a 33 y.o. male presenting with chest pain and shortness of breath. The history is provided by the patient. No language interpreter was used.  Chest Pain Pain location:  Substernal area Pain quality: aching and sharp   Pain radiates to:  Mid back Pain radiates to the back: yes   Pain severity:  Moderate Onset quality:  Gradual Duration:  9 hours Timing:  Intermittent Progression:  Waxing and waning Chronicity:  New Context: breathing, lifting, movement, raising an arm, at rest and stress   Context: no drug use, not eating, no intercourse and no trauma   Relieved by:  Nothing Worsened by:  Deep breathing and movement Ineffective treatments:  Aspirin Associated symptoms: anxiety, back pain, numbness and shortness of breath   Associated symptoms: no abdominal pain, no altered mental status, no anorexia, no claudication, no cough, no diaphoresis, no dizziness, no dysphagia, no fatigue, no fever, no headache, no heartburn, no lower extremity edema, no nausea, no near-syncope, no orthopnea, no palpitations, no PND, no syncope, not vomiting and no weakness   Risk factors: male sex and surgery   Risk factors: no aortic disease, no coronary artery disease, no diabetes mellitus, no high cholesterol, no hypertension, no immobilization, no Marfan's syndrome, not obese, not pregnant, no prior DVT/PE and no smoking   Shortness of Breath Severity:  Moderate Onset quality:  Gradual Duration:  9 hours Timing:  Constant Progression:  Improving Chronicity:  New Context: activity and emotional upset   Relieved by:  Nothing Worsened by:  Nothing tried Ineffective treatments:  None tried Associated symptoms: chest pain   Associated symptoms: no abdominal pain, no claudication, no cough, no  diaphoresis, no fever, no headaches, no PND, no syncope and no vomiting   Risk factors: tobacco use     Past Medical History  Diagnosis Date  . Hypoglycemia   . Chronic pain    Past Surgical History  Procedure Laterality Date  . Knee surgery     No family history on file. History  Substance Use Topics  . Smoking status: Current Every Day Smoker  . Smokeless tobacco: Never Used  . Alcohol Use: No    Review of Systems  Constitutional: Negative for fever, diaphoresis and fatigue.  HENT: Negative for trouble swallowing.   Respiratory: Positive for shortness of breath. Negative for cough.   Cardiovascular: Positive for chest pain. Negative for palpitations, orthopnea, claudication, syncope, PND and near-syncope.  Gastrointestinal: Negative for heartburn, nausea, vomiting, abdominal pain and anorexia.  Musculoskeletal: Positive for back pain.  Neurological: Positive for numbness. Negative for dizziness, weakness and headaches.      Allergies  Review of patient's allergies indicates no known allergies.  Home Medications   Prior to Admission medications   Medication Sig Start Date End Date Taking? Authorizing Provider  meloxicam (MOBIC) 7.5 MG tablet Take 1 tablet (7.5 mg total) by mouth daily. 08/26/13   Emmajo Bennette A Forcucci, PA-C  traMADol (ULTRAM) 50 MG tablet Take 1 tablet (50 mg total) by mouth every 6 (six) hours as needed. 08/26/13   Khiyan Crace A Forcucci, PA-C   BP 129/83  Pulse 90  Temp(Src) 98.2 F (36.8 C) (Oral)  Resp 20  Ht 5\' 10"  (1.778 m)  Wt 169 lb (  76.658 kg)  BMI 24.25 kg/m2  SpO2 98% Physical Exam  Nursing note and vitals reviewed. Constitutional: He is oriented to person, place, and time. He appears well-developed and well-nourished. No distress.  HENT:  Head: Normocephalic and atraumatic.  Mouth/Throat: Oropharynx is clear and moist. Abnormal dentition. No oropharyngeal exudate.  Eyes: Conjunctivae and EOM are normal. Pupils are equal, round, and  reactive to light. No scleral icterus.  Neck: Normal range of motion. Neck supple. No JVD present. No thyromegaly present.  Cardiovascular: Normal rate, regular rhythm, normal heart sounds and intact distal pulses.  Exam reveals no gallop and no friction rub.   No murmur heard. Pulmonary/Chest: Effort normal and breath sounds normal. No respiratory distress. He has no wheezes. He has no rales. He exhibits tenderness.  Chest tenderness to palpation which reproduces pain  Abdominal: Soft. Bowel sounds are normal. He exhibits no distension and no mass. There is no tenderness. There is no rebound and no guarding.  Musculoskeletal: Normal range of motion.  Lymphadenopathy:    He has no cervical adenopathy.  Neurological: He is alert and oriented to person, place, and time. No cranial nerve deficit. Coordination normal.  Skin: Skin is warm and dry. He is not diaphoretic.  Psychiatric: His behavior is normal. Judgment and thought content normal. His mood appears anxious.    ED Course  Procedures (including critical care time) Labs Review Labs Reviewed  BASIC METABOLIC PANEL - Abnormal; Notable for the following:    Potassium 3.6 (*)    Glucose, Bld 116 (*)    All other components within normal limits  CBC  I-STAT TROPOININ, ED    Imaging Review Dg Chest 2 View  08/26/2013   CLINICAL DATA:  Chest pain and shortness of Breath.  EXAM: CHEST  2 VIEW  COMPARISON:  07/27/2012.  FINDINGS: The heart size and mediastinal contours are within normal limits. Both lungs are clear. The visualized skeletal structures are unremarkable.  IMPRESSION: Normal chest x-ray.   Electronically Signed   By: Loralie Champagne M.D.   On: 08/26/2013 22:18     EKG Interpretation   Date/Time:  Monday August 26 2013 21:43:27 EDT Ventricular Rate:  102 PR Interval:  182 QRS Duration: 96 QT Interval:  362 QTC Calculation: 471 R Axis:   70 Text Interpretation:  Sinus tachycardia Nonspecific T wave abnormality   Abnormal ECG No significant change was found Confirmed by [facility]  MD,  TREY ([id number]) on 08/26/2013 11:16:19 PM      MDM   Final diagnoses:  Chest pain, unspecified  Tobacco dependence  Hypokalemia   Patient is a 33 y.o. Male who presents to the ED with chest pain and shortness of breath x 9 hours.  Physical exam reveals tenderness to palpation of the chest which reproduces the pain.  CBC, CXR, troponin are without abnormality at this time.  BMP shows mild hypokalemia which was replaced here at this time.  Patient was treated here with toradol and a GI cocktail with great relief of his symptoms.  I believe there may be an underlying component of anxiety to this chest pain, but that the majority of this pain is likely due to musculoskeletal causes.  Patient has a HEART score of 2-3 at this time and is low risk.  EKG is stable from previous EKG.  Will discharge the patient home with ultram and mobic qdaily with food.  Patient will be given a referral to the [facility] community [facility] wellness center at this time and  will also be given the resource list.  Patient is stable for discharge at this time.  He was advised to quit smoking.  Patient was told to return for ACS symptoms and worsening shortness of breath.  He states understanding and agreement to the above plan.  I have discussed this patient with Dr. Loretha Stapler and reviewed ECG with him and he agrees with the above plan and workup.      Eben Burow, PA-C 08/26/13 2358

## 2013-08-27 NOTE — ED Provider Notes (Signed)
Medical screening examination/treatment/procedure(s) were performed by non-physician practitioner and as supervising physician I was immediately available for consultation/collaboration.   EKG Interpretation   Date/Time:  Monday August 26 2013 21:43:27 EDT Ventricular Rate:  102 PR Interval:  182 QRS Duration: 96 QT Interval:  362 QTC Calculation: 471 R Axis:   70 Text Interpretation:  Sinus tachycardia Nonspecific T wave abnormality  Abnormal ECG No significant change was found Confirmed by [facility]  MD,  TREY ([id number]) on 08/26/2013 11:16:19 PM        Jonny RuizJohn Young Berryavid Gretna Bergin III, MD 08/27/13 0100

## 2013-11-20 ENCOUNTER — Encounter (HOSPITAL_COMMUNITY): Payer: Self-pay | Admitting: *Deleted

## 2013-11-20 ENCOUNTER — Emergency Department (HOSPITAL_COMMUNITY)
Admission: EM | Admit: 2013-11-20 | Discharge: 2013-11-20 | Disposition: A | Discharge status: Home / Self Care | Payer: Self-pay | Attending: Emergency Medicine | Admitting: Emergency Medicine

## 2013-11-20 DIAGNOSIS — L03113 Cellulitis of right upper limb: Secondary | ICD-10-CM

## 2013-11-20 DIAGNOSIS — L03129 Acute lymphangitis of unspecified part of limb: Secondary | ICD-10-CM

## 2013-11-20 DIAGNOSIS — Z791 Long term (current) use of non-steroidal anti-inflammatories (NSAID): Secondary | ICD-10-CM | POA: Insufficient documentation

## 2013-11-20 DIAGNOSIS — R21 Rash and other nonspecific skin eruption: Secondary | ICD-10-CM | POA: Insufficient documentation

## 2013-11-20 DIAGNOSIS — Z720 Tobacco use: Secondary | ICD-10-CM | POA: Insufficient documentation

## 2013-11-20 DIAGNOSIS — G8929 Other chronic pain: Secondary | ICD-10-CM | POA: Insufficient documentation

## 2013-11-20 DIAGNOSIS — L03123 Acute lymphangitis of right upper limb: Secondary | ICD-10-CM | POA: Insufficient documentation

## 2013-11-20 DIAGNOSIS — Z23 Encounter for immunization: Secondary | ICD-10-CM | POA: Insufficient documentation

## 2013-11-20 DIAGNOSIS — Z8639 Personal history of other endocrine, nutritional and metabolic disease: Secondary | ICD-10-CM | POA: Insufficient documentation

## 2013-11-20 LAB — CBC WITH DIFFERENTIAL/PLATELET
Basophils Absolute: 0 10*3/uL (ref 0.0–0.1)
Basophils Relative: 0 % (ref 0–1)
EOS ABS: 0.2 10*3/uL (ref 0.0–0.7)
Eosinophils Relative: 3 % (ref 0–5)
HCT: 44.4 % (ref 39.0–52.0)
Hemoglobin: 15.1 g/dL (ref 13.0–17.0)
Lymphocytes Relative: 16 % (ref 12–46)
Lymphs Abs: 1.6 10*3/uL (ref 0.7–4.0)
MCH: 31.1 pg (ref 26.0–34.0)
MCHC: 34 g/dL (ref 30.0–36.0)
MCV: 91.5 fL (ref 78.0–100.0)
Monocytes Absolute: 1.2 10*3/uL — ABNORMAL HIGH (ref 0.1–1.0)
Monocytes Relative: 12 % (ref 3–12)
Neutro Abs: 6.7 10*3/uL (ref 1.7–7.7)
Neutrophils Relative %: 69 % (ref 43–77)
PLATELETS: 193 10*3/uL (ref 150–400)
RBC: 4.85 MIL/uL (ref 4.22–5.81)
RDW: 12.3 % (ref 11.5–15.5)
WBC: 9.7 10*3/uL (ref 4.0–10.5)

## 2013-11-20 LAB — I-STAT CHEM 8, ED
BUN: 12 mg/dL (ref 6–23)
CALCIUM ION: 1.14 mmol/L (ref 1.12–1.23)
CREATININE: 1 mg/dL (ref 0.50–1.35)
Chloride: 101 mEq/L (ref 96–112)
Glucose, Bld: 100 mg/dL — ABNORMAL HIGH (ref 70–99)
HCT: 46 % (ref 39.0–52.0)
Hemoglobin: 15.6 g/dL (ref 13.0–17.0)
Potassium: 4.2 mEq/L (ref 3.7–5.3)
SODIUM: 138 meq/L (ref 137–147)
TCO2: 25 mmol/L (ref 0–100)

## 2013-11-20 MED ORDER — MORPHINE SULFATE 4 MG/ML IJ SOLN
4.0000 mg | Freq: Once | INTRAMUSCULAR | Status: AC
  Administered 2013-11-20: 4 mg via INTRAVENOUS
  Filled 2013-11-20: qty 1

## 2013-11-20 MED ORDER — SULFAMETHOXAZOLE-TRIMETHOPRIM 800-160 MG PO TABS: 1.0000 | ORAL_TABLET | Freq: Two times a day (BID) | ORAL | Status: AC

## 2013-11-20 MED ORDER — CLINDAMYCIN PHOSPHATE 600 MG/50ML IV SOLN
600.0000 mg | Freq: Once | INTRAVENOUS | Status: AC
  Administered 2013-11-20: 600 mg via INTRAVENOUS
  Filled 2013-11-20: qty 50

## 2013-11-20 MED ORDER — TRAMADOL HCL 50 MG PO TABS: 50.0000 mg | ORAL_TABLET | Freq: Four times a day (QID) | ORAL | Status: DC | PRN

## 2013-11-20 MED ORDER — LIDOCAINE HCL 2 % IJ SOLN
10.0000 mL | Freq: Once | INTRAMUSCULAR | Status: AC
  Administered 2013-11-20: 200 mg via INTRADERMAL
  Filled 2013-11-20: qty 20

## 2013-11-20 MED ORDER — CEPHALEXIN 500 MG PO CAPS: 500.0000 mg | ORAL_CAPSULE | Freq: Two times a day (BID) | ORAL | Status: DC

## 2013-11-20 MED ORDER — TETANUS-DIPHTH-ACELL PERTUSSIS 5-2.5-18.5 LF-MCG/0.5 IM SUSP
0.5000 mL | Freq: Once | INTRAMUSCULAR | Status: AC
  Administered 2013-11-20: 0.5 mL via INTRAMUSCULAR
  Filled 2013-11-20: qty 0.5

## 2013-11-20 MED ORDER — LIDOCAINE HCL 2 % IJ SOLN: 10.0000 mL | Freq: Once | INTRAMUSCULAR | Status: DC

## 2013-11-20 NOTE — ED Notes (Signed)
Pt reports onset yesterday of bump to right forearm and pain to arm. Today has red streaks going up his arm, denies fever. No acute distress noted at triage.

## 2013-11-20 NOTE — ED Provider Notes (Signed)
CSN: [id number]     Arrival date & time 11/20/13  1140 History   First MD Initiated Contact with Patient 11/20/13 1149     Chief Complaint  Patient presents with  . Cellulitis     (Consider location/radiation/quality/duration/timing/severity/associated sxs/prior Treatment) HPI   33 year old male with history of chronic pain who presents for evaluation of right arm pain.  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 Today in the middle of working on a truck pt noticed that his right forearm is more painful than usual,he also notice redness and a red streak extending upward to his forearm. Complaining of sharp achy pain worsening with palpation and improves with elevation. He denies having any severe headache, fever, chills, shortness of breath, numbness, weakness. Denies any insect bite. He cannot recall last tetanus shot. Patient is not a diabetic. No specific treatment tried.  Past Medical History  Diagnosis Date  . Hypoglycemia   . Chronic pain    Past Surgical History  Procedure Laterality Date  . Knee surgery     History reviewed. No pertinent family history. History  Substance Use Topics  . Smoking status: Current Every Day Smoker  . Smokeless tobacco: Never Used  . Alcohol Use: No    Review of Systems  Constitutional: Negative for fever.  Skin: Positive for rash.  Neurological: Negative for numbness.      Allergies  Review of patient's allergies indicates no known allergies.  Home Medications   Prior to Admission medications   Medication Sig Start Date End Date Taking? Authorizing Provider  meloxicam (MOBIC) 7.5 MG tablet Take 1 tablet (7.5 mg total) by mouth daily. 08/26/13   Courtney A Forcucci, PA-C  traMADol (ULTRAM) 50 MG tablet Take 1 tablet (50 mg total) by mouth every 6 (six) hours as needed. 08/26/13   Courtney A Forcucci, PA-C   BP 115/61 mmHg  Pulse 98  Temp(Src) 98.1 F (36.7 C)  (Oral)  Resp 18  SpO2 98% Physical Exam  Constitutional: He is oriented to person, place, and time. He appears well-developed and well-nourished. No distress.  Nontoxic in appearance, patient has oil/grease stain throughout face and bilateral arms from working as a Curatormechanic  HENT:  Head: Atraumatic.  Eyes: Conjunctivae are normal.  Neck: Normal range of motion. Neck supple.  Cardiovascular: Normal rate and regular rhythm.   Pulmonary/Chest: Effort normal and breath sounds normal.  Abdominal: Soft. There is no tenderness.  Neurological: He is alert and oriented to person, place, and time.  Skin: Rash (right forearm: An area of erythema measuring 3 cm in diameter to distal ulnar region with mild induration, tender to palpation, with evidence of lymphangitis extending towards elbow. Radial pulse 2+, normal grip strength, normal sensation.) noted.  Psychiatric: He has a normal mood and affect.    ED Course  Procedures (including critical care time)  12:16 PM Patient with abscess cellulitis noted to right distal forearm with lymphangitis. Is afebrile with stable normal vital sign. Given the rapid onset of his symptoms, will initiate IV antibiotic, will check basic labs and will provide pain medication. Anticipate discharge as patient requested.  1:19 PM  No evidence of abscess on I&D.  Pt received IV clindamycin but prefers cheaper alternative.  Will d/c with Keflex and Bactrim along with pain medication.  Pt made aware of risk of drowsiness with narcotic pain medication.  Return precaution discussed.  Labs are reassuring.    Labs Review  Labs Reviewed  CBC WITH DIFFERENTIAL - Abnormal; Notable for the following:    Monocytes Absolute 1.2 (*)    All other components within normal limits  I-STAT CHEM 8, ED - Abnormal; Notable for the following:    Glucose, Bld 100 (*)    All other components within normal limits    Imaging Review No results found.   EKG Interpretation None       MDM   Final diagnoses:  Right arm cellulitis  Acute lymphangitis of upper arm    BP 115/61 mmHg  Pulse 98  Temp(Src) 98.1 F (36.7 C) (Oral)  Resp 18  SpO2 98%      Fayrene HelperBowie Michalla Ringer, PA-C 11/20/13 1321  Samuel JesterKathleen McManus, DO 11/23/13 [phone]

## 2013-11-20 NOTE — Discharge Instructions (Signed)
Please take both antibiotics for the full duration as treatments for cellulitis. Make sure to eat yogurt while taking antibiotic to decrease risk of having diarrhea. Take Ultram as needed for pain but avoid driving or operating heavy machinery as it can cause drowsiness. Return in one to 2 days if you notice worsening of symptoms.  Cellulitis Cellulitis is an infection of the skin and the tissue beneath it. The infected area is usually red and tender. Cellulitis occurs most often in the arms and lower legs.  CAUSES  Cellulitis is caused by bacteria that enter the skin through cracks or cuts in the skin. The most common types of bacteria that cause cellulitis are staphylococci and streptococci. SIGNS AND SYMPTOMS   Redness and warmth.  Swelling.  Tenderness or pain.  Fever. DIAGNOSIS  Your health care provider can usually determine what is wrong based on a physical exam. Blood tests may also be done. TREATMENT  Treatment usually involves taking an antibiotic medicine. HOME CARE INSTRUCTIONS   Take your antibiotic medicine as directed by your health care provider. Finish the antibiotic even if you start to feel better.  Keep the infected arm or leg elevated to reduce swelling.  Apply a warm cloth to the affected area up to 4 times per day to relieve pain.  Take medicines only as directed by your health care provider.  Keep all follow-up visits as directed by your health care provider. SEEK MEDICAL CARE IF:   You notice red streaks coming from the infected area.  Your red area gets larger or turns dark in color.  Your bone or joint underneath the infected area becomes painful after the skin has healed.  Your infection returns in the same area or another area.  You notice a swollen bump in the infected area.  You develop new symptoms.  You have a fever. SEEK IMMEDIATE MEDICAL CARE IF:   You feel very sleepy.  You develop vomiting or diarrhea.  You have a general ill  feeling (malaise) with muscle aches and pains. MAKE SURE YOU:   Understand these instructions.  Will watch your condition.  Will get help right away if you are not doing well or get worse. Document Released: 10/06/2004 Document Revised: 05/13/2013 Document Reviewed: 03/14/2011 [facility] Patient Information 2015 [place], [place]. This information is not intended to replace advice given to you by your health care provider. Make sure you discuss any questions you have with your health care provider.

## 2013-11-24 ENCOUNTER — Encounter (HOSPITAL_COMMUNITY): Payer: Self-pay | Admitting: Emergency Medicine

## 2013-11-24 ENCOUNTER — Emergency Department (HOSPITAL_COMMUNITY)
Admission: EM | Admit: 2013-11-24 | Discharge: 2013-11-24 | Disposition: A | Discharge status: Home / Self Care | Payer: BC Managed Care – PPO | Attending: Emergency Medicine | Admitting: Emergency Medicine

## 2013-11-24 DIAGNOSIS — Z792 Long term (current) use of antibiotics: Secondary | ICD-10-CM | POA: Insufficient documentation

## 2013-11-24 DIAGNOSIS — Z79899 Other long term (current) drug therapy: Secondary | ICD-10-CM | POA: Insufficient documentation

## 2013-11-24 DIAGNOSIS — H16133 Photokeratitis, bilateral: Secondary | ICD-10-CM

## 2013-11-24 DIAGNOSIS — Z8639 Personal history of other endocrine, nutritional and metabolic disease: Secondary | ICD-10-CM | POA: Diagnosis not present

## 2013-11-24 DIAGNOSIS — Z720 Tobacco use: Secondary | ICD-10-CM | POA: Insufficient documentation

## 2013-11-24 DIAGNOSIS — H168 Other keratitis: Secondary | ICD-10-CM | POA: Diagnosis not present

## 2013-11-24 DIAGNOSIS — G8929 Other chronic pain: Secondary | ICD-10-CM | POA: Diagnosis not present

## 2013-11-24 DIAGNOSIS — H5713 Ocular pain, bilateral: Secondary | ICD-10-CM | POA: Diagnosis present

## 2013-11-24 MED ORDER — ERYTHROMYCIN 5 MG/GM OP OINT
1.0000 "application " | TOPICAL_OINTMENT | Freq: Once | OPHTHALMIC | Status: AC
  Administered 2013-11-24: 1 via OPHTHALMIC
  Filled 2013-11-24: qty 3.5

## 2013-11-24 MED ORDER — OXYCODONE-ACETAMINOPHEN 5-325 MG PO TABS: 1.0000 | ORAL_TABLET | Freq: Four times a day (QID) | ORAL | Status: DC | PRN

## 2013-11-24 MED ORDER — OXYCODONE-ACETAMINOPHEN 5-325 MG PO TABS
2.0000 | ORAL_TABLET | Freq: Once | ORAL | Status: AC
  Administered 2013-11-24: 2 via ORAL
  Filled 2013-11-24: qty 2

## 2013-11-24 MED ORDER — TETRACAINE HCL 0.5 % OP SOLN
2.0000 [drp] | Freq: Once | OPHTHALMIC | Status: AC
  Administered 2013-11-24: 2 [drp] via OPHTHALMIC
  Filled 2013-11-24: qty 2

## 2013-11-24 MED ORDER — FLUORESCEIN SODIUM 1 MG OP STRP
2.0000 | ORAL_STRIP | Freq: Once | OPHTHALMIC | Status: AC
  Administered 2013-11-24: 2 via OPHTHALMIC
  Filled 2013-11-24: qty 2

## 2013-11-24 NOTE — ED Notes (Signed)
Pt c/o welding burns to both eyes.  St's he has used Visine without relief.

## 2013-11-24 NOTE — ED Notes (Signed)
Geiple, PA at bedside.  

## 2013-11-24 NOTE — Discharge Instructions (Signed)
Please read and follow all provided instructions.  Your diagnoses today include:  1. UV keratitis, bilateral    Tests performed today include:  Visual acuity testing to check your vision  Fluorescein dye examination to look for scratches on your eye  Vital signs. See below for your results today.   Medications prescribed:   Percocet (oxycodone/acetaminophen) - narcotic pain medication  DO NOT drive or perform any activities that require you to be awake and alert because this medicine can make you drowsy. BE VERY CAREFUL not to take multiple medicines containing Tylenol (also called acetaminophen). Doing so can lead to an overdose which can damage your liver and cause liver failure and possibly death.   Erythromycin  - antibiotic eye ointment  Use this medication as follows:  Apply 1/4" of the antibiotic ointment to affected eye up to 6 times a day while awake until pain improves  Take any prescribed medications only as directed.  Home care instructions:  Follow any educational materials contained in this packet.   If you wear contact lenses, do not use them until your eye caregiver approves. Follow-up care is necessary to be sure the corneal abrasion is healing if not completely resolved in 2-3 days. See your caregiver or eye specialist as suggested for followup.   Follow-up instructions: Please follow-up with the opthalmologist listed in the next 2 days for further evaluation of your symptoms.   Return instructions:   Please return to the Emergency Department if you experience worsening symptoms.   Please return immediately if you develop severe pain, pus drainage, new change in vision, or fever.  Please return if you have any other emergent concerns.  Additional Information:  Your vital signs today were: BP 98/55 mmHg   Pulse 65   Temp(Src) 97.6 F (36.4 C) (Oral)   Resp 20   Ht 5\' 10"  (1.778 m)   Wt 170 lb (77.111 kg)   BMI 24.39 kg/m2   SpO2 93% If your blood  pressure (BP) was elevated above 135/85 this visit, please have this repeated by your doctor within one month.

## 2013-11-24 NOTE — ED Provider Notes (Signed)
CSN: [id number]     Arrival date & time 11/24/13  0141 History   First MD Initiated Contact with Patient 11/24/13 0630     Chief Complaint  Patient presents with  . Eye Pain     (Consider location/radiation/quality/duration/timing/severity/associated sxs/prior Treatment) HPI Comments: Patient presents with complaint of bilateral eye pain starting approximately 9:30 PM yesterday. 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 He developed pain shortly after finishing which gradually became worse. Patient has had "welding burn" in the past however this was more severe. Patient states that his vision was like looking through wax paper. Patient denies foreign body. He is sensitive to light. No nausea, vomiting. No fever. No drainage or discharge from the eye.  Patient is a 33 y.o. male presenting with eye pain. The history is provided by the patient.  Eye Pain Pertinent negatives include no abdominal pain, chest pain, coughing, fever, headaches, myalgias, nausea, rash, sore throat or vomiting.    Past Medical History  Diagnosis Date  . Hypoglycemia   . Chronic pain    Past Surgical History  Procedure Laterality Date  . Knee surgery    . Hernia repair     No family history on file. History  Substance Use Topics  . Smoking status: Current Every Day Smoker  . Smokeless tobacco: Never Used  . Alcohol Use: No    Review of Systems  Constitutional: Negative for fever.  HENT: Negative for rhinorrhea and sore throat.   Eyes: Positive for photophobia, pain, redness and visual disturbance. Negative for discharge and itching.  Respiratory: Negative for cough.   Cardiovascular: Negative for chest pain.  Gastrointestinal: Negative for nausea, vomiting, abdominal pain and diarrhea.  Genitourinary: Negative for dysuria.  Musculoskeletal: Negative for myalgias.  Skin: Negative for rash.  Neurological: Negative for headaches.      Allergies  Review of patient's  allergies indicates no known allergies.  Home Medications   Prior to Admission medications   Medication Sig Start Date End Date Taking? Authorizing Provider  cephALEXin (KEFLEX) 500 MG capsule Take 1 capsule (500 mg total) by mouth 2 (two) times daily. 11/20/13  Yes Fayrene HelperBowie Tran, PA-C  sulfamethoxazole-trimethoprim (BACTRIM DS,SEPTRA DS) 800-160 MG per tablet Take 1 tablet by mouth 2 (two) times daily. 11/20/13 11/27/13 Yes Fayrene HelperBowie Tran, PA-C  traMADol (ULTRAM) 50 MG tablet Take 1 tablet (50 mg total) by mouth every 6 (six) hours as needed. Patient taking differently: Take 50 mg by mouth every 6 (six) hours as needed for moderate pain.  11/20/13  Yes Fayrene HelperBowie Tran, PA-C  aspirin EC 81 MG tablet Take 81 mg by mouth once.    Historical Provider, MD  meloxicam (MOBIC) 7.5 MG tablet Take 1 tablet (7.5 mg total) by mouth daily. Patient not taking: Reported on 11/20/2013 08/26/13   Courtney A Forcucci, PA-C  oxyCODONE-acetaminophen (PERCOCET/ROXICET) 5-325 MG per tablet Take 1-2 tablets by mouth every 6 (six) hours as needed for severe pain. 11/24/13   Renne CriglerJoshua Kevin Space, PA-C   BP 98/55 mmHg  Pulse 65  Temp(Src) 97.6 F (36.4 C) (Oral)  Resp 20  Ht 5\' 10"  (1.778 m)  Wt 170 lb (77.111 kg)  BMI 24.39 kg/m2  SpO2 93%   Physical Exam  Constitutional: He appears well-developed and well-nourished.  HENT:  Head: Normocephalic and atraumatic.  Eyes: EOM are normal. Pupils are equal, round, and reactive to light. Right eye exhibits discharge (tearing mild). No foreign body present in the right eye. Left eye exhibits  discharge (tearing mild). No foreign body present in the left eye. Right conjunctiva is injected (mild). Left conjunctiva is injected (mild). Right eye exhibits normal extraocular motion. Left eye exhibits normal extraocular motion.  Slit lamp exam:      The right eye shows no corneal abrasion, no corneal flare, no corneal ulcer, no foreign body, no hyphema, no fluorescein uptake and no anterior  chamber bulge.       The left eye shows no corneal abrasion, no corneal flare, no corneal ulcer, no foreign body, no hyphema, no fluorescein uptake and no anterior chamber bulge.  Patient cannot funduscopic exam.   Neck: Normal range of motion. Neck supple.  Pulmonary/Chest: No respiratory distress.  Neurological: He is alert.  Skin: Skin is warm and dry.  Psychiatric: He has a normal mood and affect.  Nursing note and vitals reviewed.   ED Course  Procedures (including critical care time) Labs Review Labs Reviewed - No data to display  Imaging Review No results found.   EKG Interpretation None       6:35 AM Patient seen and examined. Medications ordered.   Vital signs reviewed and are as follows: BP 98/55 mmHg  Pulse 65  Temp(Src) 97.6 F (36.4 C) (Oral)  Resp 20  Ht 5\' 10"  (1.778 m)  Wt 170 lb (77.111 kg)  BMI 24.39 kg/m2  SpO2 93%   6:50 AM Two drops of tetracaine/proparacaine instilled into affected eye. His pain was improved with topical anesthetic.  Fluorescein strip applied to affected eye. Wood's lamp used to assess for corneal injury. No corneal abrasion identified. No foreign bodies noted. No visible hyphema.   Patient tolerated procedure well without immediate complication.   Will treat for UV keratitis. Patient given erythromycin ointment for lubrication and oral Percocet for pain. Patient encouraged to follow-up with ophthalmology in 2 days for a recheck. Patient states that his vision is starting to improve since last night.  6:58 AM Patient counseled on use of narcotic pain medications. Told not to take concurrently with tramadol. Counseled not to combine this medications with others containing tylenol. Urged not to drink alcohol, drive, or perform any other activities that requires focus while taking these medications. The patient verbalizes understanding and agrees with the plan.   MDM   Final diagnoses:  UV keratitis, bilateral   Patient with  history and exam consistent with UV keratitis. Unremarkable fluorescein stain. Visual acuity is as expected as patient has much tearing and difficulty opening eyes. Opthalmology referral given.   Renne CriglerJoshua Tanasha Menees, PA-C 11/24/13 [id number]  Samuel JesterKathleen McManus, DO 11/27/13 [phone]

## 2013-11-24 NOTE — ED Notes (Signed)
Pt was welding at 2100. States he can't see out of either eye

## 2013-12-05 ENCOUNTER — Encounter (HOSPITAL_COMMUNITY): Payer: Self-pay

## 2013-12-05 ENCOUNTER — Emergency Department (HOSPITAL_COMMUNITY)
Admission: EM | Admit: 2013-12-05 | Discharge: 2013-12-06 | Disposition: A | Discharge status: Home / Self Care | Payer: BC Managed Care – PPO | Attending: Emergency Medicine | Admitting: Emergency Medicine

## 2013-12-05 DIAGNOSIS — G8929 Other chronic pain: Secondary | ICD-10-CM | POA: Diagnosis not present

## 2013-12-05 DIAGNOSIS — N4829 Other inflammatory disorders of penis: Secondary | ICD-10-CM | POA: Diagnosis not present

## 2013-12-05 DIAGNOSIS — Z720 Tobacco use: Secondary | ICD-10-CM | POA: Diagnosis not present

## 2013-12-05 DIAGNOSIS — Z791 Long term (current) use of non-steroidal anti-inflammatories (NSAID): Secondary | ICD-10-CM | POA: Insufficient documentation

## 2013-12-05 DIAGNOSIS — Z792 Long term (current) use of antibiotics: Secondary | ICD-10-CM | POA: Diagnosis not present

## 2013-12-05 DIAGNOSIS — R1030 Lower abdominal pain, unspecified: Secondary | ICD-10-CM | POA: Diagnosis present

## 2013-12-05 NOTE — ED Notes (Signed)
Pt complains of groin pain that started a few days ago after having a bump on his penis that popped and scabbed over, he states the pain is intermittent and feels stabbing.

## 2013-12-06 LAB — HIV ANTIBODY (ROUTINE TESTING W REFLEX): HIV: NONREACTIVE

## 2013-12-06 LAB — RPR

## 2013-12-06 MED ORDER — LIDOCAINE HCL 1 % IJ SOLN
INTRAMUSCULAR | Status: AC
  Administered 2013-12-06: 20 mL
  Filled 2013-12-06: qty 20

## 2013-12-06 MED ORDER — CEFTRIAXONE SODIUM 250 MG IJ SOLR
250.0000 mg | Freq: Once | INTRAMUSCULAR | Status: AC
  Administered 2013-12-06: 250 mg via INTRAMUSCULAR
  Filled 2013-12-06: qty 250

## 2013-12-06 MED ORDER — AZITHROMYCIN 250 MG PO TABS
1000.0000 mg | ORAL_TABLET | Freq: Once | ORAL | Status: AC
  Administered 2013-12-06: 1000 mg via ORAL
  Filled 2013-12-06: qty 4

## 2013-12-06 NOTE — ED Provider Notes (Signed)
CSN: [id number]     Arrival date & time 12/05/13  2324 History   First MD Initiated Contact with Patient 12/05/13 2351     Chief Complaint  Patient presents with  . Groin Pain     (Consider location/radiation/quality/duration/timing/severity/associated sxs/prior Treatment) HPI  Patient to the ER with complaints of rash and bump to penis. It started one week ago with a pimple, he busted it and then it drained and he now has another bump. He has been getting intermittent pains shooting through the penis that only last a couple of seconds at a time. Denies penile discharge. Denies testicular swelling or pain. He is married and they report being monogamous. He reports the area of the bump is where he holds his penis to urinate, his hands are typically dirty because of work.  Past Medical History  Diagnosis Date  . Hypoglycemia   . Chronic pain    Past Surgical History  Procedure Laterality Date  . Knee surgery    . Hernia repair     History reviewed. No pertinent family history. History  Substance Use Topics  . Smoking status: Current Every Day Smoker  . Smokeless tobacco: Never Used  . Alcohol Use: No    Review of Systems  10 Systems reviewed and are negative for acute change except as noted in the HPI.    Allergies  Review of patient's allergies indicates no known allergies.  Home Medications   Prior to Admission medications   Medication Sig Start Date End Date Taking? Authorizing Provider  cephALEXin (KEFLEX) 500 MG capsule Take 1 capsule (500 mg total) by mouth 2 (two) times daily. Patient not taking: Reported on 12/05/2013 11/20/13   Fayrene HelperBowie Tran, PA-C  meloxicam (MOBIC) 7.5 MG tablet Take 1 tablet (7.5 mg total) by mouth daily. Patient not taking: Reported on 11/20/2013 08/26/13   Courtney A Forcucci, PA-C  oxyCODONE-acetaminophen (PERCOCET/ROXICET) 5-325 MG per tablet Take 1-2 tablets by mouth every 6 (six) hours as needed for severe pain. Patient not taking:  Reported on 12/05/2013 11/24/13   Renne CriglerJoshua Geiple, PA-C  traMADol (ULTRAM) 50 MG tablet Take 1 tablet (50 mg total) by mouth every 6 (six) hours as needed. Patient not taking: Reported on 12/05/2013 11/20/13   Fayrene HelperBowie Tran, PA-C   BP 106/65 mmHg  Pulse 87  Temp(Src) 97.4 F (36.3 C) (Oral)  Resp 16  SpO2 98% Physical Exam  Constitutional: He appears well-developed and well-nourished. No distress.  HENT:  Head: Normocephalic and atraumatic.  Eyes: Pupils are equal, round, and reactive to light.  Neck: Normal range of motion. Neck supple.  Cardiovascular: Normal rate and regular rhythm.   Pulmonary/Chest: Effort normal.  Abdominal: Soft.  Genitourinary: Testes normal.    Right testis shows no swelling and no tenderness. Left testis shows no swelling and no tenderness. Circumcised.  Small pustule towards distal penile shaft.  Neurological: He is alert.  Skin: Skin is warm and dry.  Nursing note and vitals reviewed.   ED Course  Procedures (including critical care time) Labs Review Labs Reviewed  GC/CHLAMYDIA PROBE AMP  HIV ANTIBODY (ROUTINE TESTING)  RPR    Imaging Review No results found.   EKG Interpretation None      MDM   Final diagnoses:  Infection of penis    Medications  azithromycin (ZITHROMAX) tablet 1,000 mg (not administered)  cefTRIAXone (ROCEPHIN) injection 250 mg (not administered)    HIV, RPR, GC ordered. My suspicion for hiv or syphilis is low therefore I will hold  off on treating for now. 33 y.o.Deanna ArtisWilliam H Sprankle's evaluation in the Emergency Department is complete. It has been determined that no acute conditions requiring further emergency intervention are present at this time. The patient/guardian have been advised of the diagnosis and plan. We have discussed signs and symptoms that warrant return to the ED, such as changes or worsening in symptoms.  Vital signs are stable at discharge. Filed Vitals:   12/05/13 2331  BP: 106/65  Pulse: 87    Temp: 97.4 F (36.3 C)  Resp: 16    Patient/guardian has voiced understanding and agreed to follow-up with the PCP or specialist.     Dorthula Matasiffany G Narcisa Ganesh, PA-C 12/06/13 0030  Toy CookeyMegan Docherty, MD 12/06/13 [phone]

## 2013-12-06 NOTE — Discharge Instructions (Signed)

## 2013-12-07 LAB — GC/CHLAMYDIA PROBE AMP
CT Probe RNA: NEGATIVE
GC Probe RNA: NEGATIVE

## 2013-12-13 ENCOUNTER — Emergency Department (HOSPITAL_COMMUNITY)
Admission: EM | Admit: 2013-12-13 | Discharge: 2013-12-13 | Disposition: A | Discharge status: Home / Self Care | Payer: BC Managed Care – PPO | Attending: Emergency Medicine | Admitting: Emergency Medicine

## 2013-12-13 ENCOUNTER — Encounter (HOSPITAL_COMMUNITY): Payer: Self-pay | Admitting: *Deleted

## 2013-12-13 DIAGNOSIS — Z791 Long term (current) use of non-steroidal anti-inflammatories (NSAID): Secondary | ICD-10-CM | POA: Insufficient documentation

## 2013-12-13 DIAGNOSIS — Z720 Tobacco use: Secondary | ICD-10-CM | POA: Diagnosis not present

## 2013-12-13 DIAGNOSIS — R112 Nausea with vomiting, unspecified: Secondary | ICD-10-CM | POA: Diagnosis not present

## 2013-12-13 DIAGNOSIS — Z8639 Personal history of other endocrine, nutritional and metabolic disease: Secondary | ICD-10-CM | POA: Diagnosis not present

## 2013-12-13 DIAGNOSIS — G8929 Other chronic pain: Secondary | ICD-10-CM | POA: Insufficient documentation

## 2013-12-13 DIAGNOSIS — R630 Anorexia: Secondary | ICD-10-CM | POA: Diagnosis not present

## 2013-12-13 DIAGNOSIS — R109 Unspecified abdominal pain: Secondary | ICD-10-CM | POA: Diagnosis not present

## 2013-12-13 DIAGNOSIS — R197 Diarrhea, unspecified: Secondary | ICD-10-CM | POA: Diagnosis not present

## 2013-12-13 DIAGNOSIS — R1110 Vomiting, unspecified: Secondary | ICD-10-CM | POA: Diagnosis present

## 2013-12-13 LAB — COMPREHENSIVE METABOLIC PANEL
ALBUMIN: 4.1 g/dL (ref 3.5–5.2)
ALK PHOS: 72 U/L (ref 39–117)
ALT: 17 U/L (ref 0–53)
AST: 15 U/L (ref 0–37)
Anion gap: 16 — ABNORMAL HIGH (ref 5–15)
BILIRUBIN TOTAL: 0.5 mg/dL (ref 0.3–1.2)
BUN: 9 mg/dL (ref 6–23)
CHLORIDE: 98 meq/L (ref 96–112)
CO2: 22 mEq/L (ref 19–32)
Calcium: 9.8 mg/dL (ref 8.4–10.5)
Creatinine, Ser: 0.89 mg/dL (ref 0.50–1.35)
GFR calc Af Amer: >90 mL/min (ref >90)
GFR calc non Af Amer: >90 mL/min (ref >90)
Glucose, Bld: 113 mg/dL — ABNORMAL HIGH (ref 70–99)
POTASSIUM: 4.2 meq/L (ref 3.7–5.3)
Sodium: 136 mEq/L — ABNORMAL LOW (ref 137–147)
Total Protein: 6.9 g/dL (ref 6.0–8.3)

## 2013-12-13 LAB — CBC WITH DIFFERENTIAL/PLATELET
BASOS ABS: 0 10*3/uL (ref 0.0–0.1)
BASOS PCT: 0 % (ref 0–1)
Eosinophils Absolute: 0.3 10*3/uL (ref 0.0–0.7)
Eosinophils Relative: 2 % (ref 0–5)
HCT: 44.7 % (ref 39.0–52.0)
HEMOGLOBIN: 15.9 g/dL (ref 13.0–17.0)
Lymphocytes Relative: 19 % (ref 12–46)
Lymphs Abs: 2.4 10*3/uL (ref 0.7–4.0)
MCH: 31.4 pg (ref 26.0–34.0)
MCHC: 35.6 g/dL (ref 30.0–36.0)
MCV: 88.2 fL (ref 78.0–100.0)
MONOS PCT: 7 % (ref 3–12)
Monocytes Absolute: 0.8 10*3/uL (ref 0.1–1.0)
NEUTROS ABS: 8.9 10*3/uL — AB (ref 1.7–7.7)
NEUTROS PCT: 72 % (ref 43–77)
Platelets: 269 10*3/uL (ref 150–400)
RBC: 5.07 MIL/uL (ref 4.22–5.81)
RDW: 12.3 % (ref 11.5–15.5)
WBC: 12.3 10*3/uL — AB (ref 4.0–10.5)

## 2013-12-13 LAB — CBG MONITORING, ED: Glucose-Capillary: 101 mg/dL — ABNORMAL HIGH (ref 70–99)

## 2013-12-13 LAB — LIPASE, BLOOD: Lipase: 37 U/L (ref 11–59)

## 2013-12-13 MED ORDER — ONDANSETRON 4 MG PO TBDP
8.0000 mg | ORAL_TABLET | Freq: Once | ORAL | Status: AC
  Administered 2013-12-13: 8 mg via ORAL

## 2013-12-13 MED ORDER — SODIUM CHLORIDE 0.9 % IV BOLUS (SEPSIS)
1000.0000 mL | Freq: Once | INTRAVENOUS | Status: AC
  Administered 2013-12-13: 1000 mL via INTRAVENOUS

## 2013-12-13 MED ORDER — FENTANYL CITRATE 0.05 MG/ML IJ SOLN
50.0000 ug | Freq: Once | INTRAMUSCULAR | Status: AC
  Administered 2013-12-13: 50 ug via INTRAVENOUS

## 2013-12-13 MED ORDER — ASPIRIN 81 MG PO CHEW
324.0000 mg | CHEWABLE_TABLET | Freq: Once | ORAL | Status: AC
  Administered 2013-12-13: 324 mg via ORAL

## 2013-12-13 MED ORDER — ONDANSETRON HCL 4 MG/2ML IJ SOLN
4.0000 mg | Freq: Once | INTRAMUSCULAR | Status: AC
  Administered 2013-12-13: 4 mg via INTRAVENOUS

## 2013-12-13 MED ORDER — ONDANSETRON 8 MG PO TBDP: 8.0000 mg | ORAL_TABLET | Freq: Once | ORAL | Status: DC

## 2013-12-13 MED ORDER — METRONIDAZOLE 500 MG PO TABS
500.0000 mg | ORAL_TABLET | Freq: Once | ORAL | Status: AC
  Administered 2013-12-13: 500 mg via ORAL

## 2013-12-13 MED ORDER — METRONIDAZOLE 500 MG PO TABS: 500.0000 mg | ORAL_TABLET | Freq: Two times a day (BID) | ORAL | Status: DC

## 2013-12-13 NOTE — Discharge Instructions (Signed)
As discussed, your evaluation tonight has been largely reassuring, and there is no evidence for a full-body infection, but there remains some concern for an intra-abdominal infection.  Though you have elected to leave prior to providing the last sample necessary for evaluation, we are starting empiric treatment for inflammation/infection of your colon.  Please be sure to take all medication as directed, be sure to follow-up on Monday with your physician if you have not improved.  If you develop new, or concerning changes in the interim, please be sure to return here immediately for further evaluation and management.

## 2013-12-13 NOTE — ED Provider Notes (Signed)
CSN: [id number]     Arrival date & time 12/13/13  1724 History   First MD Initiated Contact with Patient 12/13/13 1936     Chief Complaint  Patient presents with  . Emesis  . Diarrhea     (Consider location/radiation/quality/duration/timing/severity/associated sxs/prior Treatment) HPI Patient presents with concern of ongoing nausea, vomiting, diarrhea, abdominal pain. Patient has had diarrhea, diffuse crampy abdominal discomfort for almost 2 weeks since completing a course of antibiotics. Today the patient also had vomiting. No relief with anything. Patient also completes of anorexia, but denies fever, chills, confusion, disorientation, chest pain, dyspnea.  Past Medical History  Diagnosis Date  . Hypoglycemia   . Chronic pain    Past Surgical History  Procedure Laterality Date  . Knee surgery    . Hernia repair     History reviewed. No pertinent family history. History  Substance Use Topics  . Smoking status: Current Every Day Smoker  . Smokeless tobacco: Never Used  . Alcohol Use: No    Review of Systems  Constitutional:       Per HPI, otherwise negative  HENT:       Per HPI, otherwise negative  Respiratory:       Per HPI, otherwise negative  Cardiovascular:       Per HPI, otherwise negative  Gastrointestinal: Positive for nausea, vomiting, abdominal pain and diarrhea.  Endocrine:       Negative aside from HPI  Genitourinary:       Neg aside from HPI   Musculoskeletal:       Per HPI, otherwise negative  Skin: Negative.   Neurological: Negative for syncope.      Allergies  Review of patient's allergies indicates no known allergies.  Home Medications   Prior to Admission medications   Medication Sig Start Date End Date Taking? Authorizing Provider  ibuprofen (ADVIL,MOTRIN) 200 MG tablet Take 200 mg by mouth every 6 (six) hours as needed.   Yes Historical Provider, MD   BP 110/61 mmHg  Pulse 60  Temp(Src) 97.7 F (36.5 C) (Oral)  Resp 16  Ht 5'  10" (1.778 m)  Wt 170 lb (77.111 kg)  BMI 24.39 kg/m2  SpO2 97% Physical Exam  Constitutional: He is oriented to person, place, and time. He appears well-developed. No distress.  HENT:  Head: Normocephalic and atraumatic.  Eyes: Conjunctivae and EOM are normal.  Cardiovascular: Normal rate and regular rhythm.   Pulmonary/Chest: Effort normal. No stridor. No respiratory distress.  Abdominal: He exhibits no distension. There is no hepatosplenomegaly. There is no rigidity, no guarding, no tenderness at McBurney's point and negative Murphy's sign.  Diffuse minimal tenderness to palpation  Musculoskeletal: He exhibits no edema.  Neurological: He is alert and oriented to person, place, and time.  Skin: Skin is warm and dry.  Psychiatric: He has a normal mood and affect.  Nursing note and vitals reviewed.   ED Course  Procedures (including critical care time) Labs Review Labs Reviewed  CBC WITH DIFFERENTIAL - Abnormal; Notable for the following:    WBC 12.3 (*)    Neutro Abs 8.9 (*)    All other components within normal limits  COMPREHENSIVE METABOLIC PANEL - Abnormal; Notable for the following:    Sodium 136 (*)    Glucose, Bld 113 (*)    Anion gap 16 (*)    All other components within normal limits  CBG MONITORING, ED - Abnormal; Notable for the following:    Glucose-Capillary 101 (*)  All other components within normal limits  CLOSTRIDIUM DIFFICILE BY PCR  LIPASE, BLOOD  URINALYSIS, ROUTINE W REFLEX MICROSCOPIC    I reviewed the patient's chart.  10:44 PM Patient has yet to produce a bowel sample for definitive C. difficile testing. He requested discharge, stating that he has been awake for too long today.  MDM  This patient presents with ongoing abdominal discomfort, diarrhea that began after a course of antibiotics. Today patient has had vomiting as well.  Patient has no evidence of distress here, is hemodynamically stable, with reassuring labs, sent for mild  leukocytosis. Patient is soft, non-peritoneal abdomen, with no right upper quadrant tenderness, no right lower quadrant tenderness. After several hours of monitoring, fluid resuscitation, patient requested discharge prior to provide stool sample. However, given the patient's description of symptoms began after antibiotic use, he was treated empirically for likely C. difficile colitis. Absent peritoneal findings, fever, evidence for distress, he was appropriate for discharge with further evaluation, management as an outpatient.    Gerhard Munchobert Orvella Digiulio, MD 12/13/13 2248

## 2013-12-13 NOTE — ED Notes (Signed)
Pt reports having headache, diarrhea x 2 weeks and vomiting that started today, generalized fatigue.

## 2014-01-22 ENCOUNTER — Emergency Department (HOSPITAL_COMMUNITY)
Admission: EM | Admit: 2014-01-22 | Discharge: 2014-01-22 | Disposition: A | Discharge status: Home / Self Care | Payer: BLUE CROSS/BLUE SHIELD | Attending: Emergency Medicine | Admitting: Emergency Medicine

## 2014-01-22 DIAGNOSIS — G8929 Other chronic pain: Secondary | ICD-10-CM | POA: Diagnosis not present

## 2014-01-22 DIAGNOSIS — H5713 Ocular pain, bilateral: Secondary | ICD-10-CM | POA: Diagnosis present

## 2014-01-22 DIAGNOSIS — Z8639 Personal history of other endocrine, nutritional and metabolic disease: Secondary | ICD-10-CM | POA: Insufficient documentation

## 2014-01-22 DIAGNOSIS — S0501XA Injury of conjunctiva and corneal abrasion without foreign body, right eye, initial encounter: Secondary | ICD-10-CM | POA: Insufficient documentation

## 2014-01-22 DIAGNOSIS — S0502XA Injury of conjunctiva and corneal abrasion without foreign body, left eye, initial encounter: Secondary | ICD-10-CM | POA: Diagnosis not present

## 2014-01-22 DIAGNOSIS — Y998 Other external cause status: Secondary | ICD-10-CM | POA: Diagnosis not present

## 2014-01-22 DIAGNOSIS — Z720 Tobacco use: Secondary | ICD-10-CM | POA: Insufficient documentation

## 2014-01-22 DIAGNOSIS — Y9389 Activity, other specified: Secondary | ICD-10-CM | POA: Insufficient documentation

## 2014-01-22 DIAGNOSIS — H16133 Photokeratitis, bilateral: Secondary | ICD-10-CM | POA: Diagnosis not present

## 2014-01-22 DIAGNOSIS — X58XXXA Exposure to other specified factors, initial encounter: Secondary | ICD-10-CM | POA: Insufficient documentation

## 2014-01-22 DIAGNOSIS — Z79899 Other long term (current) drug therapy: Secondary | ICD-10-CM | POA: Insufficient documentation

## 2014-01-22 DIAGNOSIS — Y9289 Other specified places as the place of occurrence of the external cause: Secondary | ICD-10-CM | POA: Diagnosis not present

## 2014-01-22 MED ORDER — ERYTHROMYCIN 5 MG/GM OP OINT
1.0000 "application " | TOPICAL_OINTMENT | Freq: Once | OPHTHALMIC | Status: AC
  Administered 2014-01-22: 1 via OPHTHALMIC

## 2014-01-22 NOTE — ED Provider Notes (Signed)
CSN: [id number]     Arrival date & time 01/22/14  0250 History   First MD Initiated Contact with Patient 01/22/14 [phone]     No chief complaint on file.    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  Patient is a 34 y.o. male presenting with eye pain. The history is provided by the patient.  Eye Pain This is a recurrent problem. The current episode started less than 1 hour ago. The problem occurs constantly. The problem has not changed since onset.Pertinent negatives include no abdominal pain and no shortness of breath. Nothing aggravates the symptoms. Nothing relieves the symptoms.    Past Medical History  Diagnosis Date  . Hypoglycemia   . Chronic pain    Past Surgical History  Procedure Laterality Date  . Knee surgery    . Hernia repair     No family history on file. History  Substance Use Topics  . Smoking status: Current Every Day Smoker  . Smokeless tobacco: Never Used  . Alcohol Use: No    Review of Systems  Constitutional: Negative for fever and chills.  Eyes: Positive for pain.  Respiratory: Negative for cough and shortness of breath.   Gastrointestinal: Negative for vomiting and abdominal pain.  All other systems reviewed and are negative.     Allergies  Review of patient's allergies indicates no known allergies.  Home Medications   Prior to Admission medications   Medication Sig Start Date End Date Taking? Authorizing Provider  ibuprofen (ADVIL,MOTRIN) 200 MG tablet Take 200 mg by mouth every 6 (six) hours as needed.    Historical Provider, MD  metroNIDAZOLE (FLAGYL) 500 MG tablet Take 1 tablet (500 mg total) by mouth 2 (two) times daily. 12/13/13   Gerhard Munchobert Lockwood, MD  ondansetron (ZOFRAN-ODT) 8 MG disintegrating tablet Take 1 tablet (8 mg total) by mouth once. 12/13/13   Gerhard Munchobert Lockwood, MD   There were no  vitals taken for this visit. Physical Exam  Constitutional: He is oriented to person, place, and time. He appears well-developed and well-nourished. No distress.  HENT:  Head: Normocephalic and atraumatic.  Mouth/Throat: No oropharyngeal exudate.  Eyes: EOM are normal. Pupils are equal, round, and reactive to light.  Slit lamp exam:      The right eye shows corneal abrasion (mild uptake across entire cornea, uniform).       The left eye shows corneal abrasion (mild uptake across entire cornea, uniform).  Neck: Normal range of motion. Neck supple.  Cardiovascular: Normal rate and regular rhythm.  Exam reveals no friction rub.   No murmur heard. Pulmonary/Chest: Effort normal and breath sounds normal. No respiratory distress. He has no wheezes. He has no rales.  Abdominal: He exhibits no distension. There is no tenderness. There is no rebound.  Musculoskeletal: Normal range of motion. He exhibits no edema.  Neurological: He is alert and oriented to person, place, and time.  Skin: He is not diaphoretic.  Nursing note and vitals reviewed.   ED Course  Procedures (including critical care time) Labs Review Labs Reviewed - No data to display  Imaging Review No results found.   EKG Interpretation None      MDM   Final diagnoses:  UV keratitis, bilateral     34 year old male here with eye pain. He is a Psychologist, occupationalwelder. He did have difficulty with his visor today and was exposed to the by arc of  a welding machine. Symptoms started while here with his wife, he was asleep and woke up with eye pain. He also associated blurry vision. On exam pupils are equally round and reactive to light bilaterally. He has mild diffuse uptake across entire cornea.  Given erythromycin ointment. Pain much improved with tetracaine and is walking around in the light now. Stable for discharge, given follow-up for his UV keratitis.    Elwin Mocha, MD 01/22/14 [phone]

## 2014-02-08 ENCOUNTER — Encounter (HOSPITAL_COMMUNITY): Payer: Self-pay | Admitting: Nurse Practitioner

## 2014-02-08 ENCOUNTER — Emergency Department (HOSPITAL_COMMUNITY)
Admission: EM | Admit: 2014-02-08 | Discharge: 2014-02-08 | Disposition: A | Discharge status: Home / Self Care | Payer: BLUE CROSS/BLUE SHIELD | Attending: Emergency Medicine | Admitting: Emergency Medicine

## 2014-02-08 DIAGNOSIS — Y9389 Activity, other specified: Secondary | ICD-10-CM | POA: Diagnosis not present

## 2014-02-08 DIAGNOSIS — Z792 Long term (current) use of antibiotics: Secondary | ICD-10-CM | POA: Insufficient documentation

## 2014-02-08 DIAGNOSIS — Y9289 Other specified places as the place of occurrence of the external cause: Secondary | ICD-10-CM | POA: Diagnosis not present

## 2014-02-08 DIAGNOSIS — G8929 Other chronic pain: Secondary | ICD-10-CM | POA: Insufficient documentation

## 2014-02-08 DIAGNOSIS — S0502XA Injury of conjunctiva and corneal abrasion without foreign body, left eye, initial encounter: Secondary | ICD-10-CM | POA: Insufficient documentation

## 2014-02-08 DIAGNOSIS — Y998 Other external cause status: Secondary | ICD-10-CM | POA: Diagnosis not present

## 2014-02-08 DIAGNOSIS — X58XXXA Exposure to other specified factors, initial encounter: Secondary | ICD-10-CM | POA: Diagnosis not present

## 2014-02-08 DIAGNOSIS — Z720 Tobacco use: Secondary | ICD-10-CM | POA: Diagnosis not present

## 2014-02-08 DIAGNOSIS — S0592XA Unspecified injury of left eye and orbit, initial encounter: Secondary | ICD-10-CM | POA: Diagnosis present

## 2014-02-08 MED ORDER — HYDROCODONE-ACETAMINOPHEN 5-325 MG PO TABS: ORAL_TABLET | ORAL | Status: DC

## 2014-02-08 MED ORDER — FLUORESCEIN SODIUM 1 MG OP STRP
1.0000 | ORAL_STRIP | Freq: Once | OPHTHALMIC | Status: AC
  Administered 2014-02-08: 1 via OPHTHALMIC
  Filled 2014-02-08: qty 1

## 2014-02-08 MED ORDER — TETRACAINE HCL 0.5 % OP SOLN
2.0000 [drp] | Freq: Once | OPHTHALMIC | Status: AC
Start: 2014-02-08 — End: 2014-02-08
  Administered 2014-02-08: 2 [drp] via OPHTHALMIC
  Filled 2014-02-08: qty 2

## 2014-02-08 NOTE — Discharge Instructions (Signed)
Please read and follow all provided instructions.  Your diagnoses today include:  1. Corneal abrasion, left, initial encounter     Tests performed today include:  Visual acuity testing to check your vision  Fluorescein dye examination to look for scratches on your eye  Vital signs. See below for your results today.   Medications prescribed:   Vicodin (hydrocodone/acetaminophen) - narcotic pain medication  DO NOT drive or perform any activities that require you to be awake and alert because this medicine can make you drowsy. BE VERY CAREFUL not to take multiple medicines containing Tylenol (also called acetaminophen). Doing so can lead to an overdose which can damage your liver and cause liver failure and possibly death.  Take any prescribed medications only as directed.  Home care instructions:  Use home erythromycin ointment as directed:  Erythromycin  - antibiotic eye ointment  Use this medication as follows:  Apply 1/4" of the antibiotic ointment to affected eye up to 6 times a day while awake for 7 days  Follow any educational materials contained in this packet.  You have a scratch of the eye on the cornea (the clear part of the eye). This condition may be caused by trauma. It is a common problem for people who wear contact lenses. Proper treatment is important. No evidence of infection is noted today, but you could develop an infection called a corneal ulcer or have some retained foreign body that may or may not have been noted today in the Emergency Department. Ulcers are not only painful, but they may also scar the cornea and cause permanent damage to the eye.   If you wear contact lenses, do not use them until your eye caregiver approves. Follow-up care is necessary to be sure the corneal abrasion is healing if not completely resolved in 2-3 days. See your caregiver or eye specialist as suggested for followup.   Follow-up instructions: Please follow-up with the  opthalmologist listed in the next 2-3 days for further evaluation of your symptoms.   Return instructions:   Please return to the Emergency Department if you experience worsening symptoms.   Please return immediately if you develop severe pain, pus drainage, new change in vision, or fever.  Please return if you have any other emergent concerns.  Additional Information:  Your vital signs today were: BP 122/83 mmHg   Pulse 95   Temp(Src) 97.8 F (36.6 C) (Oral)   Resp 14   Ht 5\' 10"  (1.778 m)   Wt 180 lb (81.647 kg)   BMI 25.83 kg/m2   SpO2 100% If your blood pressure (BP) was elevated above 135/85 this visit, please have this repeated by your doctor within one month.

## 2014-02-08 NOTE — ED Notes (Signed)
Pt endorses left eye irritation started last night. Pt endorses eye pain, irritation and blurry vision. Mildly injected. Pt sts this morning noticed green-grey discharge from eye.

## 2014-02-08 NOTE — ED Provider Notes (Signed)
CSN: [id number]     Arrival date & time 02/08/14  1041 History  This chart was scribed for Rhea BleacherJosh Courtlynn Holloman, PA-C, working with Tilden FossaElizabeth Rees, MD by Chestine SporeSoijett Blue, ED Scribe. The patient was seen in room TR04C/TR04C at 11:07 AM.    Chief Complaint  Patient presents with  . Eye Pain    The history is provided by the patient. No language interpreter was used.    HPI Comments: Robert Quinn is a 34 y.o. male who presents to the Emergency Department complaining of gradual left eye pain onset last night. He states that is started last night as just an irritated feeling. He reports that later in the night, his vision began to become blurry. He does not think that there is a foreign body in his eye. He notes that his current pain is different from when he was seen here in 01/22/14 for UV keratitis. 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 Patient was working yesterday but doesn't remember getting a foreign body in his eye. There is pain primarily when he tries to close his eye. He states that he is having associated symptoms of watery eyes, eye discharge, photophobia, blurred vision.   Past Medical History  Diagnosis Date  . Hypoglycemia   . Chronic pain    Past Surgical History  Procedure Laterality Date  . Knee surgery    . Hernia repair     No family history on file. History  Substance Use Topics  . Smoking status: Current Every Day Smoker -- 1.00 packs/day for 9 years    Types: Cigarettes  . Smokeless tobacco: Never Used  . Alcohol Use: No     Comment: social drinker     Review of Systems  Constitutional: Negative for fever.  HENT: Negative for rhinorrhea and sore throat.   Eyes: Positive for photophobia, pain, discharge (tearing), redness and visual disturbance. Negative for itching.       Watery left eye  Respiratory: Negative for cough.   Cardiovascular: Negative for chest pain.  Gastrointestinal: Negative for nausea, vomiting, abdominal pain and diarrhea.  Genitourinary: Negative for  dysuria.  Musculoskeletal: Negative for myalgias.  Skin: Negative for rash.  Neurological: Negative for headaches.   Allergies  Review of patient's allergies indicates no known allergies.  Home Medications   Prior to Admission medications   Medication Sig Start Date End Date Taking? Authorizing Provider  ibuprofen (ADVIL,MOTRIN) 200 MG tablet Take 200 mg by mouth every 6 (six) hours as needed.    Historical Provider, MD  metroNIDAZOLE (FLAGYL) 500 MG tablet Take 1 tablet (500 mg total) by mouth 2 (two) times daily. 12/13/13   Gerhard Munchobert Lockwood, MD  ondansetron (ZOFRAN-ODT) 8 MG disintegrating tablet Take 1 tablet (8 mg total) by mouth once. 12/13/13   Gerhard Munchobert Lockwood, MD   BP 122/83 mmHg  Pulse 95  Temp(Src) 97.8 F (36.6 C) (Oral)  Resp 14  Ht 5\' 10"  (1.778 m)  Wt 180 lb (81.647 kg)  BMI 25.83 kg/m2  SpO2 100%   Physical Exam  Constitutional: He is oriented to person, place, and time. He appears well-developed and well-nourished. No distress.  HENT:  Head: Normocephalic and atraumatic.  Eyes: EOM are normal. Pupils are equal, round, and reactive to light. Right eye exhibits no chemosis, no discharge, no exudate and no hordeolum. No foreign body present in the right eye. Left eye exhibits discharge (tearing). Left eye exhibits no chemosis, no exudate and no hordeolum. Foreign body (possible punctate FB vs  abrasion) present in the left eye. Right conjunctiva is not injected. Left conjunctiva is injected. Right eye exhibits normal extraocular motion. Left eye exhibits normal extraocular motion.  Slit lamp exam:      The left eye shows corneal abrasion, foreign body (FB vs punctate abrasion) and fluorescein uptake. The left eye shows no corneal flare, no corneal ulcer and no hyphema.    There is a punctate area of uptake, 3 o'clock approximately 1-79mm from center to pupil. On evaluation with slit lamp, it is difficult to tell if this is a punctate abrasion versus small foreign body  adherent to the cornea.   No pain in left eye with light shined in right.   Neck: Normal range of motion. Neck supple. No tracheal deviation present.  Cardiovascular: Normal rate.   Pulmonary/Chest: Effort normal. No respiratory distress.  Musculoskeletal: Normal range of motion.  Neurological: He is alert and oriented to person, place, and time.  Skin: Skin is warm and dry.  Psychiatric: He has a normal mood and affect. His behavior is normal.  Nursing note and vitals reviewed.   ED Course  Procedures (including critical care time) DIAGNOSTIC STUDIES: Oxygen Saturation is 100% on room air, normal by my interpretation.    COORDINATION OF CARE: 11:35 AM-Discussed treatment plan which includes referral to ophthalmologist with pt at bedside and pt agreed to plan.   Labs Review Labs Reviewed - No data to display  Imaging Review No results found.   EKG Interpretation None       Two drops of tetracaine instilled into affected eye with relief of pain.   Fluorescein strip applied to affected eye. Wood's lamp and slit lamp used to assess for corneal abrasion. Corneal abrasion vs punctate foreign body noted.    1L NS irrigation performed with Lequita Halt lens. Patient feels much better after irrigation and eye is no longer tearing and he can keep it open.   Reassessment performed after irrigation, punctate area is unchanged, suspect abrasion. Patient improved. Appears more comfortable.   Patient tolerated procedure well without immediate complication.   MDM   Final diagnoses:  Corneal abrasion, left, initial encounter   Patient with eye pain, small punctate abrasion versus punctate FB on exam. No surrounding erythema, swelling, vision changes/loss suspicious for orbital or periorbital cellulitis. No signs of iritis. No signs of glaucoma. No symptoms of retinal detachment. No ophthalmologic emergency suspected. Outpatient referral given to ensure resolution. Neg Seidel sign.   I  personally performed the services described in this documentation, which was scribed in my presence. The recorded information has been reviewed and is accurate.    Renne Crigler, PA-C 02/08/14 1334  Tilden Fossa, MD 02/08/14 [phone]

## 2014-02-08 NOTE — ED Notes (Signed)
Declined W/C at D/C and was escorted to lobby by RN. 

## 2014-03-13 ENCOUNTER — Encounter (HOSPITAL_COMMUNITY): Payer: Self-pay | Admitting: Emergency Medicine

## 2014-03-13 ENCOUNTER — Emergency Department (HOSPITAL_COMMUNITY)
Admission: EM | Admit: 2014-03-13 | Discharge: 2014-03-13 | Disposition: A | Discharge status: Home / Self Care | Payer: BLUE CROSS/BLUE SHIELD | Attending: Emergency Medicine | Admitting: Emergency Medicine

## 2014-03-13 DIAGNOSIS — M79601 Pain in right arm: Secondary | ICD-10-CM | POA: Diagnosis not present

## 2014-03-13 DIAGNOSIS — Z8639 Personal history of other endocrine, nutritional and metabolic disease: Secondary | ICD-10-CM | POA: Insufficient documentation

## 2014-03-13 DIAGNOSIS — G8929 Other chronic pain: Secondary | ICD-10-CM | POA: Diagnosis not present

## 2014-03-13 DIAGNOSIS — Z792 Long term (current) use of antibiotics: Secondary | ICD-10-CM | POA: Diagnosis not present

## 2014-03-13 DIAGNOSIS — Z720 Tobacco use: Secondary | ICD-10-CM | POA: Insufficient documentation

## 2014-03-13 MED ORDER — HYDROCODONE-ACETAMINOPHEN 5-325 MG PO TABS
2.0000 | ORAL_TABLET | Freq: Once | ORAL | Status: AC
  Administered 2014-03-13: 2 via ORAL
  Filled 2014-03-13: qty 2

## 2014-03-13 MED ORDER — ONDANSETRON 4 MG PO TBDP
4.0000 mg | ORAL_TABLET | Freq: Once | ORAL | Status: AC
  Administered 2014-03-13: 4 mg via ORAL
  Filled 2014-03-13: qty 1

## 2014-03-13 MED ORDER — HYDROCODONE-ACETAMINOPHEN 5-325 MG PO TABS: 1.0000 | ORAL_TABLET | Freq: Four times a day (QID) | ORAL | Status: DC | PRN

## 2014-03-13 MED ORDER — LIDOCAINE 5 % EX PTCH: 1.0000 | MEDICATED_PATCH | CUTANEOUS | Status: DC

## 2014-03-13 NOTE — ED Notes (Signed)
Pt states that right arm has had shooting pains that started in shoulder that has caused numbness/tingling in arm. Gradually gotten worse-- unable to lift things or move arm up .

## 2014-03-13 NOTE — ED Provider Notes (Signed)
CSN: [id number]     Arrival date & time 03/13/14  1857 History  This chart was scribed for non-physician practitioner, Arthor Captain PA,  working with Gwyneth Sprout, MD, by Tanda Rockers, ED Scribe. This patient was seen in room TR09C/TR09C and the patient's care was started at 8:35 PM.    Chief Complaint  Patient presents with  . Arm Pain   The history is provided by the patient and the spouse. No language interpreter was used.     HPI Comments: Robert Quinn is a 34 y.o. male who presents to the Emergency Department complaining of right sided arm pain that began 1 week ago. Pt describes it as a soreness. The pain began in his right elbow and radiates to his shoulder and right chest intermittently. He states that he is unable to lift or move the arm. Pt denies having any previous similar symptoms in the past. 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 Pt denies any other symptoms.   Past Medical History  Diagnosis Date  . Hypoglycemia   . Chronic pain    Past Surgical History  Procedure Laterality Date  . Knee surgery    . Hernia repair     No family history on file. History  Substance Use Topics  . Smoking status: Current Every Day Smoker -- 1.00 packs/day for 9 years    Types: Cigarettes  . Smokeless tobacco: Never Used  . Alcohol Use: No     Comment: social drinker     Review of Systems  Cardiovascular:       Chest wall pain.   Musculoskeletal:       Right arm pain. Painful range of motion.       Allergies  Review of patient's allergies indicates no known allergies.  Home Medications   Prior to Admission medications   Medication Sig Start Date End Date Taking? Authorizing Provider  HYDROcodone-acetaminophen (NORCO/VICODIN) 5-325 MG per tablet Take 1-2 tablets every 6 hours as needed for severe pain 02/08/14   Renne Crigler, PA-C  ibuprofen (ADVIL,MOTRIN) 200 MG tablet Take 200 mg by mouth every 6 (six) hours as  needed.    Historical Provider, MD  metroNIDAZOLE (FLAGYL) 500 MG tablet Take 1 tablet (500 mg total) by mouth 2 (two) times daily. 12/13/13   Gerhard Munch, MD  ondansetron (ZOFRAN-ODT) 8 MG disintegrating tablet Take 1 tablet (8 mg total) by mouth once. 12/13/13   Gerhard Munch, MD   Triage Vitals: BP 121/76 mmHg  Pulse 81  Temp(Src) 98 F (36.7 C) (Oral)  Resp 16  Ht  (1.803 m)  Wt 176 lb (79.833 kg)  BMI 24.56 kg/m2  SpO2 99%   Physical Exam  Constitutional: He is oriented to person, place, and time. He appears well-developed and well-nourished. No distress.  HENT:  Head: Normocephalic and atraumatic.  Eyes: Conjunctivae and EOM are normal.  Neck: Neck supple. No tracheal deviation present.  Cardiovascular: Normal rate, regular rhythm and normal heart sounds.   Pulmonary/Chest: Effort normal and breath sounds normal. No respiratory distress.  Musculoskeletal: Normal range of motion.  Exquisitely tender to palpation musculature of right shoulder girdle. Full strength with abduction, adduction, extension, flexion, internal and external rotation of right arm. No bony tenderness of elbow. Full strength. Grip strength equal bilaterally. Sensation intact. Axial cervical compression test = negative.   Neurological: He is alert and oriented to person, place, and time.  Skin:  Skin is warm and dry.  Psychiatric: He has a normal mood and affect. His behavior is normal.  Nursing note and vitals reviewed.   ED Course  Procedures (including critical care time)  DIAGNOSTIC STUDIES: Oxygen Saturation is 99% on RA, normal by my interpretation.    COORDINATION OF CARE: 8:36 PM-Discussed treatment plan which includes referral to orthopedist as well as prescription for pain medication with pt at bedside and pt agreed to plan.   Labs Review Labs Reviewed - No data to display  Imaging Review No results found.   EKG Interpretation None      MDM   Final diagnoses:  Right arm  pain    Patient with right sided arm pain and shooting pains on the arm. Negative cervical axial load test. Pain is reproducible with palpation of the muscle tissue. For this is likely muscular in origin. We'll refer the patient to orthopedics for further evaluation.    I personally performed the services described in this documentation, which was scribed in my presence. The recorded information has been reviewed and is accurate.       Arthor CaptainAbigail Hugo Lybrand, PA-C 03/14/14 0144  Gwyneth SproutWhitney Plunkett, MD 03/14/14 2252

## 2014-03-13 NOTE — Discharge Instructions (Signed)
Musculoskeletal Pain Musculoskeletal pain is muscle and boney aches and pains. These pains can occur in any part of the body. Your caregiver may treat you without knowing the cause of the pain. They may treat you if blood or urine tests, X-rays, and other tests were normal.  CAUSES There is often not a definite cause or reason for these pains. These pains may be caused by a type of germ (virus). The discomfort may also come from overuse. Overuse includes working out too hard when your body is not fit. Boney aches also come from weather changes. Bone is sensitive to atmospheric pressure changes. HOME CARE INSTRUCTIONS   Ask when your test results will be ready. Make sure you get your test results.  Only take over-the-counter or prescription medicines for pain, discomfort, or fever as directed by your caregiver. If you were given medications for your condition, do not drive, operate machinery or power tools, or sign legal documents for 24 hours. Do not drink alcohol. Do not take sleeping pills or other medications that may interfere with treatment.  Continue all activities unless the activities cause more pain. When the pain lessens, slowly resume normal activities. Gradually increase the intensity and duration of the activities or exercise.  During periods of severe pain, bed rest may be helpful. Lay or sit in any position that is comfortable.  Putting ice on the injured area.  Put ice in a bag.  Place a towel between your skin and the bag.  Leave the ice on for 15 to 20 minutes, 3 to 4 times a day.  Follow up with your caregiver for continued problems and no reason can be found for the pain. If the pain becomes worse or does not go away, it may be necessary to repeat tests or do additional testing. Your caregiver may need to look further for a possible cause. SEEK IMMEDIATE MEDICAL CARE IF:  You have pain that is getting worse and is not relieved by medications.  You develop chest pain  that is associated with shortness or breath, sweating, feeling sick to your stomach (nauseous), or throw up (vomit).  Your pain becomes localized to the abdomen.  You develop any new symptoms that seem different or that concern you. MAKE SURE YOU:   Understand these instructions.  Will watch your condition.  Will get help right away if you are not doing well or get worse. Document Released: 12/27/2004 Document Revised: 03/21/2011 Document Reviewed: 08/31/2012 [facility] Patient Information 2015 [place], [place]. This information is not intended to replace advice given to you by your health care provider. Make sure you discuss any questions you have with your health care provider.  Neuropathic Pain We often think that pain has a physical cause. If we get rid of the cause, the pain should go away. Nerves themselves can also cause pain. It is called neuropathic pain, which means nerve abnormality. It may be difficult for the patients who have it and for the treating caregivers. Pain is usually described as acute (short-lived) or chronic (long-lasting). Acute pain is related to the physical sensations caused by an injury. It can last from a few seconds to many weeks, but it usually goes away when normal healing occurs. Chronic pain lasts beyond the typical healing time. With neuropathic pain, the nerve fibers themselves may be damaged or injured. They then send incorrect signals to other pain centers. The pain you feel is real, but the cause is not easy to find.  CAUSES  Chronic pain  can result from diseases, such as diabetes and shingles (an infection related to chickenpox), or from trauma, surgery, or amputation. It can also happen without any known injury or disease. The nerves are sending pain messages, even though there is no identifiable cause for such messages.   Other common causes of neuropathy include diabetes, phantom limb pain, or Regional Pain Syndrome (RPS).  As with all forms of chronic  back pain, if neuropathy is not correctly treated, there can be a number of associated problems that lead to a downward cycle for the patient. These include depression, sleeplessness, feelings of fear and anxiety, limited social interaction and inability to do normal daily activities or work.  The most dramatic and mysterious example of neuropathic pain is called "phantom limb syndrome." This occurs when an arm or a leg has been removed because of illness or injury. The brain still gets pain messages from the nerves that originally carried impulses from the missing limb. These nerves now seem to misfire and cause troubling pain.  Neuropathic pain often seems to have no cause. It responds poorly to standard pain treatment. Neuropathic pain can occur after:  Shingles (herpes zoster virus infection).  A lasting burning sensation of the skin, caused usually by injury to a peripheral nerve.  Peripheral neuropathy which is widespread nerve damage, often caused by diabetes or alcoholism.  Phantom limb pain following an amputation.  Facial nerve problems (trigeminal neuralgia).  Multiple sclerosis.  Reflex sympathetic dystrophy.  Pain which comes with cancer and cancer chemotherapy.  Entrapment neuropathy such as when pressure is put on a nerve such as in carpal tunnel syndrome.  Back, leg, and hip problems (sciatica).  Spine or back surgery.  HIV Infection or AIDS where nerves are infected by viruses. Your caregiver can explain items in the above list which may apply to you. SYMPTOMS  Characteristics of neuropathic pain are:  Severe, sharp, electric shock-like, shooting, lightening-like, knife-like.  Pins and needles sensation.  Deep burning, deep cold, or deep ache.  Persistent numbness, tingling, or weakness.  Pain resulting from light touch or other stimulus that would not usually cause pain.  Increased sensitivity to something that would normally cause pain, such as a  pinprick. Pain may persist for months or years following the healing of damaged tissues. When this happens, pain signals no longer sound an alarm about current injuries or injuries about to happen. Instead, the alarm system itself is not working correctly.  Neuropathic pain may get worse instead of better over time. For some people, it can lead to serious disability. It is important to be aware that severe injury in a limb can occur without a proper, protective pain response.Burns, cuts, and other injuries may go unnoticed. Without proper treatment, these injuries can become infected or lead to further disability. Take any injury seriously, and consult your caregiver for treatment. DIAGNOSIS  When you have a pain with no known cause, your caregiver will probably ask some specific questions:   Do you have any other conditions, such as diabetes, shingles, multiple sclerosis, or HIV infection?  How would you describe your pain? (Neuropathic pain is often described as shooting, stabbing, burning, or searing.)  Is your pain worse at any time of the day? (Neuropathic pain is usually worse at night.)  Does the pain seem to follow a certain physical pathway?  Does the pain come from an area that has missing or injured nerves? (An example would be phantom limb pain.)  Is the pain triggered by  minor things such as rubbing against the sheets at night? These questions often help define the type of pain involved. Once your caregiver knows what is happening, treatment can begin. Anticonvulsant, antidepressant drugs, and various pain relievers seem to work in some cases. If another condition, such as diabetes is involved, better management of that disorder may relieve the neuropathic pain.  TREATMENT  Neuropathic pain is frequently long-lasting and tends not to respond to treatment with narcotic type pain medication. It may respond well to other drugs such as antiseizure and antidepressant medications. Usually,  neuropathic problems do not completely go away, but partial improvement is often possible with proper treatment. Your caregivers have large numbers of medications available to treat you. Do not be discouraged if you do not get immediate relief. Sometimes different medications or a combination of medications will be tried before you receive the results you are hoping for. See your caregiver if you have pain that seems to be coming from nowhere and does not go away. Help is available.  SEEK IMMEDIATE MEDICAL CARE IF:   There is a sudden change in the quality of your pain, especially if the change is on only one side of the body.  You notice changes of the skin, such as redness, black or purple discoloration, swelling, or an ulcer.  You cannot move the affected limbs. Document Released: 09/24/2003 Document Revised: 03/21/2011 Document Reviewed: 09/24/2003 [facility] Patient Information 2015 [place]. This information is not intended to replace advice given to you by your health care provider. Make sure you discuss any questions you have with your health care provider.  Trigger Point Injection Trigger points are areas where you have muscle pain. A trigger point injection is a shot given in the trigger point to relieve that pain. A trigger point might feel like a knot in your muscle. It hurts to press on a trigger point. Sometimes the pain spreads out (radiates) to other parts of the body. For example, pressing on a trigger point in your shoulder might cause pain in your arm or neck. You might have one trigger point. Or, you might have more than one. People often have trigger points in their upper back and lower back. They also occur often in the neck and shoulders. Pain from a trigger point lasts for a long time. It can make it hard to keep moving. You might not be able to do the exercise or physical therapy that could help you deal with the pain. A trigger point injection may help. It does not work  for everyone. But, it may relieve your pain for a few days or a few months. A trigger point injection does not cure long-lasting (chronic) pain. LET YOUR CAREGIVER KNOW ABOUT:  Any allergies (especially to latex, lidocaine, or steroids).  Blood-thinning medicines that you take. These drugs can lead to bleeding or bruising after an injection. They include:  Aspirin.  Ibuprofen.  Clopidogrel.  Warfarin.  Other medicines you take. This includes all vitamins, herbs, eyedrops, over-the-counter medicines, and creams.  Use of steroids.  Recent infections.  Past problems with numbing medicines.  Bleeding problems.  Surgeries you have had.  Other health problems. RISKS AND COMPLICATIONS A trigger point injection is a safe treatment. However, problems may develop, such as:  Minor side effects usually go away in 1 to 2 days. These may include:  Soreness.  Bruising.  Stiffness.  More serious problems are rare. But, they may include:  Bleeding under the skin (hematoma).  Skin  infection.  Breaking off of the needle under your skin.  Lung puncture.  The trigger point injection may not work for you. BEFORE THE PROCEDURE You may need to stop taking any medicine that thins your blood. This is to prevent bleeding and bruising. Usually these medicines are stopped several days before the injection. No other preparation is needed. PROCEDURE  A trigger point injection can be given in your caregiver's office or in a clinic. Each injection takes 2 minutes or less.  Your caregiver will feel for trigger points. The caregiver may use a marker to circle the area for the injection.  The skin over the trigger point will be washed with a germ-killing (antiseptic) solution.  The caregiver pinches the spot for the injection.  Then, a very thin needle is used for the shot. You may feel pain or a twitching feeling when the needle enters the trigger point.  A numbing solution may be  injected into the trigger point. Sometimes a drug to keep down swelling, redness, and warmth (inflammation) is also injected.  Your caregiver moves the needle around the trigger zone until the tightness and twitching goes away.  After the injection, your caregiver may put gentle pressure over the injection site.  Then it is covered with a bandage. AFTER THE PROCEDURE  You can go right home after the injection.  The bandage can be taken off after a few hours.  You may feel sore and stiff for 1 to 2 days.  Go back to your regular activities slowly. Your caregiver may ask you to stretch your muscles. Do not do anything that takes extra energy for a few days.  Follow your caregiver's instructions to manage and treat other pain. Document Released: 12/16/2010 Document Revised: 04/23/2012 Document Reviewed: 12/16/2010 [facility] Patient Information 2015 [place], [place]. This information is not intended to replace advice given to you by your health care provider. Make sure you discuss any questions you have with your health care provider.  Heat Therapy Heat therapy can help ease sore, stiff, injured, and tight muscles and joints. Heat relaxes your muscles, which may help ease your pain.  RISKS AND COMPLICATIONS If you have any of the following conditions, do not use heat therapy unless your health care provider has approved:  Poor circulation.  Healing wounds or scarred skin in the area being treated.  Diabetes, heart disease, or high blood pressure.  Not being able to feel (numbness) the area being treated.  Unusual swelling of the area being treated.  Active infections.  Blood clots.  Cancer.  Inability to communicate pain. This may include young children and people who have problems with their brain function (dementia).  Pregnancy. Heat therapy should only be used on old, pre-existing, or long-lasting (chronic) injuries. Do not use heat therapy on new injuries unless directed  by your health care provider. HOW TO USE HEAT THERAPY There are several different kinds of heat therapy, including:  Moist heat pack.  Warm water bath.  Hot water bottle.  Electric heating pad.  Heated gel pack.  Heated wrap.  Electric heating pad. Use the heat therapy method suggested by your health care provider. Follow your health care provider's instructions on when and how to use heat therapy. GENERAL HEAT THERAPY RECOMMENDATIONS  Do not sleep while using heat therapy. Only use heat therapy while you are awake.  Your skin may turn pink while using heat therapy. Do not use heat therapy if your skin turns red.  Do not use heat  therapy if you have new pain.  High heat or long exposure to heat can cause burns. Be careful when using heat therapy to avoid burning your skin.  Do not use heat therapy on areas of your skin that are already irritated, such as with a rash or sunburn. SEEK MEDICAL CARE IF:  You have blisters, redness, swelling, or numbness.  You have new pain.  Your pain is worse. MAKE SURE YOU:  Understand these instructions.  Will watch your condition.  Will get help right away if you are not doing well or get worse. Document Released: 03/21/2011 Document Revised: 05/13/2013 Document Reviewed: 02/19/2013 [facility] Patient Information 2015 [place], [place]. This information is not intended to replace advice given to you by your health care provider. Make sure you discuss any questions you have with your health care provider.

## 2014-05-11 ENCOUNTER — Emergency Department (HOSPITAL_COMMUNITY)
Admission: EM | Admit: 2014-05-11 | Discharge: 2014-05-11 | Disposition: A | Discharge status: Home / Self Care | Payer: BLUE CROSS/BLUE SHIELD | Attending: Emergency Medicine | Admitting: Emergency Medicine

## 2014-05-11 ENCOUNTER — Encounter (HOSPITAL_COMMUNITY): Payer: Self-pay

## 2014-05-11 ENCOUNTER — Emergency Department (HOSPITAL_COMMUNITY): Payer: BLUE CROSS/BLUE SHIELD

## 2014-05-11 DIAGNOSIS — Z8639 Personal history of other endocrine, nutritional and metabolic disease: Secondary | ICD-10-CM | POA: Insufficient documentation

## 2014-05-11 DIAGNOSIS — Z720 Tobacco use: Secondary | ICD-10-CM | POA: Insufficient documentation

## 2014-05-11 DIAGNOSIS — M62830 Muscle spasm of back: Secondary | ICD-10-CM | POA: Diagnosis not present

## 2014-05-11 DIAGNOSIS — G8929 Other chronic pain: Secondary | ICD-10-CM | POA: Insufficient documentation

## 2014-05-11 DIAGNOSIS — M546 Pain in thoracic spine: Secondary | ICD-10-CM | POA: Diagnosis present

## 2014-05-11 DIAGNOSIS — Z79899 Other long term (current) drug therapy: Secondary | ICD-10-CM | POA: Insufficient documentation

## 2014-05-11 DIAGNOSIS — Z792 Long term (current) use of antibiotics: Secondary | ICD-10-CM | POA: Insufficient documentation

## 2014-05-11 MED ORDER — DIAZEPAM 5 MG/ML IJ SOLN
5.0000 mg | Freq: Once | INTRAMUSCULAR | Status: AC
  Administered 2014-05-11: 5 mg via INTRAMUSCULAR
  Filled 2014-05-11: qty 2

## 2014-05-11 MED ORDER — CYCLOBENZAPRINE HCL 10 MG PO TABS: 10.0000 mg | ORAL_TABLET | Freq: Two times a day (BID) | ORAL | Status: DC | PRN

## 2014-05-11 MED ORDER — KETOROLAC TROMETHAMINE 60 MG/2ML IM SOLN
60.0000 mg | Freq: Once | INTRAMUSCULAR | Status: AC
  Administered 2014-05-11: 60 mg via INTRAMUSCULAR
  Filled 2014-05-11: qty 2

## 2014-05-11 MED ORDER — MELOXICAM 15 MG PO TABS: 15.0000 mg | ORAL_TABLET | Freq: Every day | ORAL | Status: DC

## 2014-05-11 NOTE — ED Notes (Signed)
Denies any issues with bowel/bladder urination.

## 2014-05-11 NOTE — ED Provider Notes (Signed)
CSN: [id number]     Arrival date & time 05/11/14  1820 History  This chart was scribed for non-physician provider Emilia BeckKaitlyn Manon Banbury, Pa-C, working with Richardean Canalavid H Yao, MD by Phillis HaggisGabriella Gaje, ED Scribe. This patient was seen in room TR09C/TR09C and patient care was started at 7:23 PM.    Chief Complaint  Patient presents with  . Back Pain   Patient is a 34 y.o. male presenting with back pain. The history is provided by the patient. No language interpreter was used.  Back Pain Associated symptoms: no abdominal pain, no dysuria and no numbness   HPI Comments: Robert Quinn is a 34 y.o. male who presents to the Emergency Department complaining of sudden mid back pain onset 4 hours ago. He states that he was cutting drywall when the pain started. He states that he took 6 ibuprofen to no relief. Patient reports that pain increases with movement. Patient denies urinary problems, numbness, or abdominal pain.  Past Medical History  Diagnosis Date  . Hypoglycemia   . Chronic pain    Past Surgical History  Procedure Laterality Date  . Knee surgery    . Hernia repair     No family history on file. History  Substance Use Topics  . Smoking status: Current Every Day Smoker -- 1.00 packs/day for 9 years    Types: Cigarettes  . Smokeless tobacco: Never Used  . Alcohol Use: No     Comment: social drinker     Review of Systems  Gastrointestinal: Negative for abdominal pain.  Genitourinary: Negative for dysuria, frequency, hematuria and difficulty urinating.  Musculoskeletal: Positive for back pain.  Neurological: Negative for numbness.  All other systems reviewed and are negative.  Allergies  Review of patient's allergies indicates no known allergies.  Home Medications   Prior to Admission medications   Medication Sig Start Date End Date Taking? Authorizing Provider  HYDROcodone-acetaminophen (NORCO) 5-325 MG per tablet Take 1-2 tablets by mouth every 6 (six) hours as needed for moderate  pain. 03/13/14   Arthor CaptainAbigail Harris, PA-C  ibuprofen (ADVIL,MOTRIN) 200 MG tablet Take 200 mg by mouth every 6 (six) hours as needed.    Historical Provider, MD  lidocaine (LIDODERM) 5 % Place 1 patch onto the skin daily. Remove & Discard patch within 12 hours or as directed by MD 03/13/14   Arthor CaptainAbigail Harris, PA-C  metroNIDAZOLE (FLAGYL) 500 MG tablet Take 1 tablet (500 mg total) by mouth 2 (two) times daily. 12/13/13   Gerhard Munchobert Lockwood, MD  ondansetron (ZOFRAN-ODT) 8 MG disintegrating tablet Take 1 tablet (8 mg total) by mouth once. 12/13/13   Gerhard Munchobert Lockwood, MD   BP 118/63 mmHg  Pulse 83  Temp(Src) 98.3 F (36.8 C) (Oral)  Resp 22  SpO2 98%  Physical Exam  Constitutional: He is oriented to person, place, and time. He appears well-developed and well-nourished. No distress.  HENT:  Head: Normocephalic and atraumatic.  Eyes: Conjunctivae and EOM are normal.  Neck: Normal range of motion. Neck supple.  Cardiovascular: Normal rate, regular rhythm and normal heart sounds.   Pulmonary/Chest: Effort normal and breath sounds normal.  Abdominal: Soft. He exhibits no distension. There is no tenderness. There is no rebound.  Musculoskeletal: Normal range of motion. He exhibits no edema.  No midline spine tenderness; left thoracic paraspinal tenderness to palpation  Neurological: He is alert and oriented to person, place, and time. Coordination normal.  Lower extremity strength and sensation intact and equal bilaterally  Skin: Skin is warm and dry.  Psychiatric: He has a normal mood and affect. His behavior is normal.  Nursing note and vitals reviewed.   ED Course  Procedures (including critical care time) DIAGNOSTIC STUDIES: Oxygen Saturation is 98% on room air, normal by my interpretation.    COORDINATION OF CARE: 7:24 PM-Discussed treatment plan which includes x-ray and muscle relaxers with pt at bedside and pt agreed to plan.   Labs Review Labs Reviewed - No data to display  Imaging Review Dg  Thoracic Spine 2 View  05/11/2014   CLINICAL DATA:  Twisting injury with upper back pain.  EXAM: THORACIC SPINE - 2 VIEW  COMPARISON:  08/26/2013  FINDINGS: There is no evidence of thoracic spine fracture. Alignment is normal. No other significant bone abnormalities are identified.  IMPRESSION: Negative.   Electronically Signed   By: Gaylyn Rong M.D.   On: 05/11/2014 20:19   Dg Lumbar Spine Complete  05/11/2014   CLINICAL DATA:  Fall, severe pain. Pain with movement. Upper lower back pain.  EXAM: LUMBAR SPINE - COMPLETE 4+ VIEW  COMPARISON:  CT 06/08/2010  FINDINGS: Normal alignment of lumbar vertebral bodies. No loss of vertebral body height or disc height. No pars fracture. No subluxation.  IMPRESSION: Normal lumbar spine radiographs.   Electronically Signed   By: Genevive Bi M.D.   On: 05/11/2014 20:19     EKG Interpretation None      MDM   Final diagnoses:  Back muscle spasm    8:41 PM Patient reports improvement of pain with toradol and flexeril. Xrays unremarkable for acute changes. No bladder or bowel incontinence or saddle paresthesias. Vitals stable and patient afebrile.   I personally performed the services described in this documentation, which was scribed in my presence. The recorded information has been reviewed and is accurate.    Emilia Beck, PA-C 05/11/14 2043  [name], MD 05/12/14 [phone]

## 2014-05-11 NOTE — Discharge Instructions (Signed)
Take mobic as needed for pain. Take flexeril as needed for muscle spasm. Refer to attached documents for more information.  °

## 2014-05-11 NOTE — ED Notes (Signed)
Pt here for mid back pain, no known injury. Started hurting today. No urinary problems. Hurts more with movement.

## 2014-05-14 ENCOUNTER — Emergency Department (HOSPITAL_COMMUNITY)
Admission: EM | Admit: 2014-05-14 | Discharge: 2014-05-15 | Disposition: A | Discharge status: Home / Self Care | Payer: BLUE CROSS/BLUE SHIELD | Attending: Emergency Medicine | Admitting: Emergency Medicine

## 2014-05-14 ENCOUNTER — Encounter (HOSPITAL_COMMUNITY): Payer: Self-pay | Admitting: *Deleted

## 2014-05-14 DIAGNOSIS — Y92410 Unspecified street and highway as the place of occurrence of the external cause: Secondary | ICD-10-CM | POA: Insufficient documentation

## 2014-05-14 DIAGNOSIS — Y998 Other external cause status: Secondary | ICD-10-CM | POA: Diagnosis not present

## 2014-05-14 DIAGNOSIS — Z720 Tobacco use: Secondary | ICD-10-CM | POA: Diagnosis not present

## 2014-05-14 DIAGNOSIS — Z792 Long term (current) use of antibiotics: Secondary | ICD-10-CM | POA: Diagnosis not present

## 2014-05-14 DIAGNOSIS — M545 Low back pain, unspecified: Secondary | ICD-10-CM

## 2014-05-14 DIAGNOSIS — R252 Cramp and spasm: Secondary | ICD-10-CM | POA: Insufficient documentation

## 2014-05-14 DIAGNOSIS — Y9389 Activity, other specified: Secondary | ICD-10-CM | POA: Diagnosis not present

## 2014-05-14 DIAGNOSIS — G8929 Other chronic pain: Secondary | ICD-10-CM | POA: Insufficient documentation

## 2014-05-14 DIAGNOSIS — S3992XA Unspecified injury of lower back, initial encounter: Secondary | ICD-10-CM | POA: Insufficient documentation

## 2014-05-14 DIAGNOSIS — Z791 Long term (current) use of non-steroidal anti-inflammatories (NSAID): Secondary | ICD-10-CM | POA: Diagnosis not present

## 2014-05-14 DIAGNOSIS — Z8639 Personal history of other endocrine, nutritional and metabolic disease: Secondary | ICD-10-CM | POA: Insufficient documentation
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
## 2014-05-15 NOTE — ED Notes (Signed)
*  Pt refused discharge vitals, states he did not even want to come his wife made him.

## 2014-05-15 NOTE — Discharge Instructions (Signed)
Use your home medications for pain. Use heat to the areas of soreness. Avoid heavy lifting over 10 pounds. Follow up with [facility] in 1 week for recheck of symptoms. Return to the ER for changes or worsening symptoms.   Back Pain, Adult Low back pain is very common. About 1 in 5 people have back pain.The cause of low back pain is rarely dangerous. The pain often gets better over time.About half of people with a sudden onset of back pain feel better in just 2 weeks. About 8 in 10 people feel better by 6 weeks.  CAUSES Some common causes of back pain include:  Strain of the muscles or ligaments supporting the spine.  Wear and tear (degeneration) of the spinal discs.  Arthritis.  Direct injury to the back. DIAGNOSIS Most of the time, the direct cause of low back pain is not known.However, back pain can be treated effectively even when the exact cause of the pain is unknown.Answering your caregiver's questions about your overall health and symptoms is one of the most accurate ways to make sure the cause of your pain is not dangerous. If your caregiver needs more information, he or she may order lab work or imaging tests (X-rays or MRIs).However, even if imaging tests show changes in your back, this usually does not require surgery. HOME CARE INSTRUCTIONS For many people, back pain returns.Since low back pain is rarely dangerous, it is often a condition that people can learn to [facility] their own.   Remain active. It is stressful on the back to sit or stand in one place. Do not sit, drive, or stand in one place for more than 30 minutes at a time. Take short walks on level surfaces as soon as pain allows.Try to increase the length of time you walk each day.  Do not stay in bed.Resting more than 1 or 2 days can delay your recovery.  Do not avoid exercise or work.Your body is made to move.It is not dangerous to be active, even though your back may hurt.Your back will  likely heal faster if you return to being active before your pain is gone.  Pay attention to your body when you bend and lift. Many people have less discomfortwhen lifting if they bend their knees, keep the load close to their bodies,and avoid twisting. Often, the most comfortable positions are those that put less stress on your recovering back.  Find a comfortable position to sleep. Use a firm mattress and lie on your side with your knees slightly bent. If you lie on your back, put a pillow under your knees.  Only take over-the-counter or prescription medicines as directed by your caregiver. Over-the-counter medicines to reduce pain and inflammation are often the most helpful.Your caregiver may prescribe muscle relaxant drugs.These medicines help dull your pain so you can more quickly return to your normal activities and healthy exercise.  Put ice on the injured area.  Put ice in a plastic bag.  Place a towel between your skin and the bag.  Leave the ice on for 15-20 minutes, 03-04 times a day for the first 2 to 3 days. After that, ice and heat may be alternated to reduce pain and spasms.  Ask your caregiver about trying back exercises and gentle massage. This may be of some benefit.  Avoid feeling anxious or stressed.Stress increases muscle tension and can worsen back pain.It is important to recognize when you are anxious or stressed and learn ways to manage it.Exercise  is a great option. SEEK MEDICAL CARE IF:  You have pain that is not relieved with rest or medicine.  You have pain that does not improve in 1 week.  You have new symptoms.  You are generally not feeling well. SEEK IMMEDIATE MEDICAL CARE IF:   You have pain that radiates from your back into your legs.  You develop new bowel or bladder control problems.  You have unusual weakness or numbness in your arms or legs.  You develop nausea or vomiting.  You develop abdominal pain.  You feel faint. Document  Released: 12/27/2004 Document Revised: 06/28/2011 Document Reviewed: 04/30/2013 [facility] Patient Information 2015 [place], [place]. This information is not intended to replace advice given to you by your health care provider. Make sure you discuss any questions you have with your health care provider.  Back Injury Prevention The following tips can help you to prevent a back injury. PHYSICAL FITNESS  Exercise often. Try to develop strong stomach (abdominal) muscles.  Do aerobic exercises often. This includes walking, jogging, biking, swimming.  Do exercises that help with balance and strength often. This includes tai chi and yoga.  Stretch before and after you exercise.  Keep a healthy weight. DIET   Ask your doctor how much calcium and vitamin D you need every day.  Include calcium in your diet. Foods high in calcium include dairy products; green, leafy vegetables; and products with calcium added (fortified).  Include vitamin D in your diet. Foods high in vitamin D include milk and products with vitamin D added.  Think about taking a multivitamin or other nutritional products called " supplements."  Stop smoking if you smoke. POSTURE   Sit and stand up straight. Avoid leaning forward or hunching over.  Choose chairs that support your lower back.  If you work at a desk:  Sit close to your work so you do not lean over.  Keep your chin tucked in.  Keep your neck drawn back.  Keep your elbows bent at a right angle. Your arms should look like the letter "L."  Sit high and close to the steering wheel when you drive. Add low back support to your car seat if needed.  Avoid sitting or standing in one position for too long. Get up and move around every hour. Take breaks if you are driving for a long time.  Sleep on your side with your knees slightly bent. You can also sleep on your back with a pillow under your knees. Do not sleep on your stomach. LIFTING, TWISTING, AND  REACHING  Avoid heavy lifting, especially lifting over and over again. If you must do heavy lifting:  Stretch before lifting.  Work slowly.  Rest between lifts.  Use carts and dollies to move objects when possible.  Make several small trips instead of carrying 1 heavy load.  Ask for help when you need it.  Ask for help when moving big, awkward objects.  Follow these steps when lifting:  Stand with your feet shoulder-width apart.  Get as close to the object as you can. Do not pick up heavy objects that are far from your body.  Use handles or lifting straps when possible.  Bend at your knees. Squat down, but keep your heels off the floor.  Keep your shoulders back, your chin tucked in, and your back straight.  Lift the object slowly. Tighten the muscles in your legs, stomach, and butt. Keep the object as close to the center of your body as  possible.  Reverse these directions when you put a load down.  Do not:  Lift the object above your waist.  Twist at the waist while lifting or carrying a load. Move your feet if you need to turn, not your waist.  Bend over without bending at your knees.  Avoid reaching over your head, across a table, or for an object on a high surface. OTHER TIPS  Avoid wet floors and keep sidewalks clear of ice.  Do not sleep on a mattress that is too soft or too hard.  Keep items that you use often within easy reach.  Put heavier objects on shelves at waist level. Put lighter objects on lower or higher shelves.  Find ways to lessen your stress. You can try exercise, massage, or relaxation.  Get help for depression or anxiety if needed. GET HELP IF:  You injure your back.  You have questions about diet, exercise, or other ways to prevent back injuries. MAKE SURE YOU:  Understand these instructions.  Will watch your condition.  Will get help right away if you are not doing well or get worse. Document Released: 06/15/2007 Document  Revised: 03/21/2011 Document Reviewed: 02/07/2011 [facility] Patient Information 2015 [place], [place]. This information is not intended to replace advice given to you by your health care provider. Make sure you discuss any questions you have with your health care provider.  Back Exercises Back exercises help treat and prevent back injuries. The goal is to increase your strength in your belly (abdominal) and back muscles. These exercises can also help with flexibility. Start these exercises when told by your doctor. HOME CARE Back exercises include: Pelvic Tilt.  Lie on your back with your knees bent. Tilt your pelvis until the lower part of your back is against the floor. Hold this position 5 to 10 sec. Repeat this exercise 5 to 10 times. Knee to Chest.  Pull 1 knee up against your chest and hold for 20 to 30 seconds. Repeat this with the other knee. This may be done with the other leg straight or bent, whichever feels better. Then, pull both knees up against your chest. Sit-Ups or Curl-Ups.  Bend your knees 90 degrees. Start with tilting your pelvis, and do a partial, slow sit-up. Only lift your upper half 30 to 45 degrees off the floor. Take at least 2 to 3 seonds for each sit-up. Do not do sit-ups with your knees out straight. If partial sit-ups are difficult, simply do the above but with only tightening your belly (abdominal) muscles and holding it as told. Hip-Lift.  Lie on your back with your knees flexed 90 degrees. Push down with your feet and shoulders as you raise your hips 2 inches off the floor. Hold for 10 seconds, repeat 5 to 10 times. Back Arches.  Lie on your stomach. Prop yourself up on bent elbows. Slowly press on your hands, causing an arch in your low back. Repeat 3 to 5 times. Shoulder-Lifts.  Lie face down with arms beside your body. Keep hips and belly pressed to floor as you slowly lift your head and shoulders off the floor. Do not overdo your exercises. Be careful in  the beginning. Exercises may cause you some mild back discomfort. If the pain lasts for more than 15 minutes, stop the exercises until you see your doctor. Improvement with exercise for back problems is slow.  Document Released: 01/29/2010 Document Revised: 03/21/2011 Document Reviewed: 10/28/2010 [facility] Patient Information 2015 [place], [place]. This information is  not intended to replace advice given to you by your health care provider. Make sure you discuss any questions you have with your health care provider.  Heat Therapy Heat therapy can help ease sore, stiff, injured, and tight muscles and joints. Heat relaxes your muscles, which may help ease your pain.  RISKS AND COMPLICATIONS If you have any of the following conditions, do not use heat therapy unless your health care provider has approved:  Poor circulation.  Healing wounds or scarred skin in the area being treated.  Diabetes, heart disease, or high blood pressure.  Not being able to feel (numbness) the area being treated.  Unusual swelling of the area being treated.  Active infections.  Blood clots.  Cancer.  Inability to communicate pain. This may include young children and people who have problems with their brain function (dementia).  Pregnancy. Heat therapy should only be used on old, pre-existing, or long-lasting (chronic) injuries. Do not use heat therapy on new injuries unless directed by your health care provider. HOW TO USE HEAT THERAPY There are several different kinds of heat therapy, including:  Moist heat pack.  Warm water bath.  Hot water bottle.  Electric heating pad.  Heated gel pack.  Heated wrap.  Electric heating pad. Use the heat therapy method suggested by your health care provider. Follow your health care provider's instructions on when and how to use heat therapy. GENERAL HEAT THERAPY RECOMMENDATIONS  Do not sleep while using heat therapy. Only use heat therapy while you are  awake.  Your skin may turn pink while using heat therapy. Do not use heat therapy if your skin turns red.  Do not use heat therapy if you have new pain.  High heat or long exposure to heat can cause burns. Be careful when using heat therapy to avoid burning your skin.  Do not use heat therapy on areas of your skin that are already irritated, such as with a rash or sunburn. SEEK MEDICAL CARE IF:  You have blisters, redness, swelling, or numbness.  You have new pain.  Your pain is worse. MAKE SURE YOU:  Understand these instructions.  Will watch your condition.  Will get help right away if you are not doing well or get worse. Document Released: 03/21/2011 Document Revised: 05/13/2013 Document Reviewed: 02/19/2013 [facility] Patient Information 2015 [place], [place]. This information is not intended to replace advice given to you by your health care provider. Make sure you discuss any questions you have with your health care provider.

## 2014-12-16 ENCOUNTER — Emergency Department (HOSPITAL_COMMUNITY)
Admission: EM | Admit: 2014-12-16 | Discharge: 2014-12-16 | Disposition: A | Discharge status: Home / Self Care | Payer: BC Managed Care – PPO | Attending: Emergency Medicine | Admitting: Emergency Medicine

## 2014-12-16 ENCOUNTER — Encounter (HOSPITAL_COMMUNITY): Payer: Self-pay | Admitting: Emergency Medicine

## 2014-12-16 DIAGNOSIS — K029 Dental caries, unspecified: Secondary | ICD-10-CM

## 2014-12-16 DIAGNOSIS — G8929 Other chronic pain: Secondary | ICD-10-CM | POA: Insufficient documentation

## 2014-12-16 DIAGNOSIS — K047 Periapical abscess without sinus: Secondary | ICD-10-CM | POA: Insufficient documentation

## 2014-12-16 DIAGNOSIS — Z791 Long term (current) use of non-steroidal anti-inflammatories (NSAID): Secondary | ICD-10-CM | POA: Insufficient documentation

## 2014-12-16 DIAGNOSIS — F17210 Nicotine dependence, cigarettes, uncomplicated: Secondary | ICD-10-CM | POA: Insufficient documentation

## 2014-12-16 DIAGNOSIS — Z8639 Personal history of other endocrine, nutritional and metabolic disease: Secondary | ICD-10-CM | POA: Insufficient documentation

## 2014-12-16 DIAGNOSIS — K0510 Chronic gingivitis, plaque induced: Secondary | ICD-10-CM

## 2014-12-16 MED ORDER — PENICILLIN V POTASSIUM 500 MG PO TABS: 500.0000 mg | ORAL_TABLET | Freq: Four times a day (QID) | ORAL | Status: AC

## 2014-12-16 MED ORDER — NAPROXEN 500 MG PO TABS: 500.0000 mg | ORAL_TABLET | Freq: Two times a day (BID) | ORAL | Status: DC

## 2014-12-16 NOTE — ED Notes (Signed)
C/o dental pain

## 2014-12-16 NOTE — ED Provider Notes (Signed)
CSN: [id number]     Arrival date & time 12/16/14  1000 History  By signing my name below, I, Ronney Lion, attest that this documentation has been prepared under the direction and in the presence of Salmaan Patchin A Bevin Mayall, PA-C. Electronically Signed: Ronney Lion, ED Scribe. 12/16/2014. 10:29 AM.    No chief complaint on file.  The history is provided by the patient. No language interpreter was used.    HPI Comments: Robert Quinn is a 34 y.o. male with a history of hypogylcemia and chronic pain, who presents to the Emergency Department complaining of right upper dental pain several days ago, since he returned from [place]. He states he had an URI that began while he was in [place] and reports continued, but improving, nasal congestion. Patient has been taking Tylenol, which he states he has had to take constantly with moderate relief to his pain. He reports he has also been using mouthwash, which completely alleviates his pain for 1 hour before returning ago. Patient reports a history of smoking. He denies international travel. Patient has NKDA. He denies fever.   Past Medical History  Diagnosis Date  . Hypoglycemia   . Chronic pain    Past Surgical History  Procedure Laterality Date  . Knee surgery    . Hernia repair     No family history on file. Social History  Substance Use Topics  . Smoking status: Current Every Day Smoker -- 1.00 packs/day for 9 years    Types: Cigarettes  . Smokeless tobacco: Never Used  . Alcohol Use: No     Comment: social drinker     Review of Systems  Constitutional: Negative for fever.  HENT: Positive for congestion and dental problem.    Allergies  Review of patient's allergies indicates no known allergies.  Home Medications   Prior to Admission medications   Medication Sig Start Date End Date Taking? Authorizing Provider  cyclobenzaprine (FLEXERIL) 10 MG tablet Take 1 tablet (10 mg total) by mouth 2 (two) times daily as needed for muscle spasms. 05/11/14    Emilia Beck, PA-C  HYDROcodone-acetaminophen (NORCO) 5-325 MG per tablet Take 1-2 tablets by mouth every 6 (six) hours as needed for moderate pain. 03/13/14   Arthor Captain, PA-C  ibuprofen (ADVIL,MOTRIN) 200 MG tablet Take 200 mg by mouth every 6 (six) hours as needed.    Historical Provider, MD  lidocaine (LIDODERM) 5 % Place 1 patch onto the skin daily. Remove & Discard patch within 12 hours or as directed by MD 03/13/14   Arthor Captain, PA-C  meloxicam (MOBIC) 15 MG tablet Take 1 tablet (15 mg total) by mouth daily. 05/11/14   Kaitlyn Szekalski, PA-C  metroNIDAZOLE (FLAGYL) 500 MG tablet Take 1 tablet (500 mg total) by mouth 2 (two) times daily. 12/13/13   Gerhard Munch, MD  ondansetron (ZOFRAN-ODT) 8 MG disintegrating tablet Take 1 tablet (8 mg total) by mouth once. 12/13/13   Gerhard Munch, MD   BP 136/83 mmHg  Pulse 83  Temp(Src) 97.6 F (36.4 C) (Oral)  Resp 20  Ht  (1.778 m)  Wt 176 lb 6.4 oz (80.015 kg)  BMI 25.31 kg/m2  SpO2 98% Physical Exam  Constitutional: He is oriented to person, place, and time. He appears well-developed and well-nourished. No distress.  HENT:  Head: Normocephalic and atraumatic.  Right Ear: Hearing, tympanic membrane, external ear and ear canal normal.  Left Ear: Hearing, tympanic membrane, external ear and ear canal normal.  Nose: Rhinorrhea present. No  mucosal edema.  Mouth/Throat: Uvula is midline and oropharynx is clear and moist.  Poor dentition with widespread dental decay. Left upper gum erythema, swelling, gingivitis. Purulent drainage noted from just above left upper second bicuspid. Tender to palpation.  Eyes: Conjunctivae and EOM are normal.  Neck: Neck supple. No tracheal deviation present.  Cardiovascular: Normal rate.   Pulmonary/Chest: Effort normal. No respiratory distress.  Musculoskeletal: Normal range of motion.  Neurological: He is alert and oriented to person, place, and time.  Skin: Skin is warm and dry.  Psychiatric:  He has a normal mood and affect. His behavior is normal.  Nursing note and vitals reviewed.   ED Course  Procedures (including critical care time)  DIAGNOSTIC STUDIES: Oxygen Saturation is 98% on RA, normal by my interpretation.    COORDINATION OF CARE: 10:20 AM - Discussed treatment plan with pt at bedside. Pt verbalized understanding and agreed to plan.   MDM   Final diagnoses:  Gingivitis  Dental abscess  Infected dental carries   Patient with toothache.  Extensive gingivitis and beginning of an abscess visualized on exam. Exam unconcerning for Ludwig's angina or spread of infection.  Will treat with Rx penicillin and naproxen. Advised pt to continue using mouthwash.  Urged patient to follow-up with dentist; will give referral list. Pt verbalized understanding and agreed to plan.      Filed Vitals:   12/16/14 1009  BP: 136/83  Pulse: 83  Temp: 97.6 F (36.4 C)  TempSrc: Oral  Resp: 20  Height: 5\' 10"  (1.778 m)  Weight: 80.015 kg  SpO2: 98%    I personally performed the services described in this documentation, which was scribed in my presence. The recorded information has been reviewed and is accurate.     Jaynie Crumbleatyana Lulie Hurd, PA-C 12/16/14 1033  Arby BarretteMarcy Pfeiffer, MD 12/24/14 1840

## 2014-12-16 NOTE — Discharge Instructions (Signed)
Naprosyn for pain and inflammation. Penicillin as prescribed until all gone for infection. Follow up with a dentist/ oral surgeon. Return if worsening.   Gingivitis Gingivitis is a form of gum (periodontal) disease that causes redness, soreness, and swelling (inflammation) of your gums. CAUSES The most common cause of gingivitis is poor oral hygiene. A sticky substance made of bacteria, mucus, and food particles (plaque), is deposited on the exposed part of teeth. As plaque builds up, it reacts with the saliva in your mouth to form something called  tartar. Tartar is a hard deposit that becomes trapped around the base of the tooth. Plaque and tartar irritate the gums, leading to the formation of gingivitis. Other factors that increase your risk for gingivitis include:   Tobacco use.  Diabetes.  Older age.  Certain medications.  Certain viral or fungal infections.  Dry mouth.  Hormonal changes such as during pregnancy.  Poor nutrition.  Substance abuse.  Poor fitting dental restorations or appliances. SYMPTOMS You may notice inflammation of the soft tissue (gingiva) around the teeth. When these tissues become inflamed, they bleed easily, especially during flossing or brushing. The gums may also be:   Tender to the touch.  Bright red, purple red, or have a shiny appearance.  Swollen.  Wearing away from the teeth (receding), which exposes more of the tooth. Bad breath is often present. Continued infection around teeth can eventually cause cavities and loosen teeth. This may lead to eventual tooth loss. DIAGNOSIS A medical and dental history will be taken. Your mouth, teeth, and gums will be examined. Your dentist will look for soft, swollen purple-red, irritated gums. There may be deposits of plaque and tartar at the base of the teeth. Your gums will be looked at for the degree of redness, puffiness, and bleeding tendencies. Your dentist will see if any of the teeth are loose.  X-rays may be taken to see if the inflammation has spread to the supporting structures of the teeth. TREATMENT The goal is to reduce and reverse the inflammation. Proper treatment can usually reverse the symptoms of gingivitis and prevent further progression of the disease. Have your teeth cleaned. During the cleaning, all plaque and tartar will be removed. Instruction for proper home care will be given. You will need regular professional cleanings and check-ups in the future. HOME CARE INSTRUCTIONS  Brush your teeth twice a day and floss at least once per day. When flossing, it is best to floss first then brush.  Limit sugar between meals and maintain a well-balanced diet.  Even the best dental hygiene will not prevent plaque from developing. It is necessary for you to see your dentist on a regular basis for cleaning and regular checkups.  Your dentist can recommend proper oral hygiene and mouth care and suggest special toothpastes or mouth rinses.  Stop smoking. SEEK DENTAL OR MEDICAL CARE IF:  You have painful, reddened tissue around your teeth, or you have puffy swollen gums.  You have difficulty chewing.  You notice any loose or infected teeth.  You have swollen glands.  Your gums bleed easily when you brush your teeth or are very tender to the touch.   This information is not intended to replace advice given to you by your health care provider. Make sure you discuss any questions you have with your health care provider.   Document Released: 06/22/2000 Document Revised: 03/21/2011 Document Reviewed: 08/11/2014 Elsevier Interactive Patient Education 2016 [employer]   Dental Abscess A dental abscess is a  collection of pus in or around a tooth. CAUSES This condition is caused by a bacterial infection around the root of the tooth that involves the inner part of the tooth (pulp). It may result from:  Severe tooth decay.  Trauma to the tooth that allows bacteria to enter  into the pulp, such as a broken or chipped tooth.  Severe gum disease around a tooth. SYMPTOMS Symptoms of this condition include:  Severe pain in and around the infected tooth.  Swelling and redness around the infected tooth, in the mouth, or in the face.  Tenderness.  Pus drainage.  Bad breath.  Bitter taste in the mouth.  Difficulty swallowing.  Difficulty opening the mouth.  Nausea.  Vomiting.  Chills.  Swollen neck glands.  Fever. DIAGNOSIS This condition is diagnosed with examination of the infected tooth. During the exam, your dentist may tap on the infected tooth. Your dentist will also ask about your medical and dental history and may order X-rays. TREATMENT This condition is treated by eliminating the infection. This may be done with:  Antibiotic medicine.  A root canal. This may be performed to save the tooth.  Pulling (extracting) the tooth. This may also involve draining the abscess. This is done if the tooth cannot be saved. HOME CARE INSTRUCTIONS  Take medicines only as directed by your dentist.  If you were prescribed antibiotic medicine, finish all of it even if you start to feel better.  Rinse your mouth (gargle) often with salt water to relieve pain or swelling.  Do not drive or operate heavy machinery while taking pain medicine.  Do not apply heat to the outside of your mouth.  Keep all follow-up visits as directed by your dentist. This is important. SEEK MEDICAL CARE IF:  Your pain is worse and is not helped by medicine. SEEK IMMEDIATE MEDICAL CARE IF:  You have a fever or chills.  Your symptoms suddenly get worse.  You have a very bad headache.  You have problems breathing or swallowing.  You have trouble opening your mouth.  You have swelling in your neck or around your eye.   This information is not intended to replace advice given to you by your health care provider. Make sure you discuss any questions you have with your  health care provider.   Document Released: 12/27/2004 Document Revised: 05/13/2014 Document Reviewed: 12/24/2013 Elsevier Interactive Patient Education 2016 Elsevier Inc.  Dental Abscess A dental abscess is a collection of pus in or around a tooth. CAUSES This condition is caused by a bacterial infection around the root of the tooth that involves the inner part of the tooth (pulp). It may result from:  Severe tooth decay.  Trauma to the tooth that allows bacteria to enter into the pulp, such as a broken or chipped tooth.  Severe gum disease around a tooth. SYMPTOMS Symptoms of this condition include:  Severe pain in and around the infected tooth.  Swelling and redness around the infected tooth, in the mouth, or in the face.  Tenderness.  Pus drainage.  Bad breath.  Bitter taste in the mouth.  Difficulty swallowing.  Difficulty opening the mouth.  Nausea.  Vomiting.  Chills.  Swollen neck glands.  Fever. DIAGNOSIS This condition is diagnosed with examination of the infected tooth. During the exam, your dentist may tap on the infected tooth. Your dentist will also ask about your medical and dental history and may order X-rays. TREATMENT This condition is treated by eliminating the infection. This  may be done with:  Antibiotic medicine.  A root canal. This may be performed to save the tooth.  Pulling (extracting) the tooth. This may also involve draining the abscess. This is done if the tooth cannot be saved. HOME CARE INSTRUCTIONS  Take medicines only as directed by your dentist.  If you were prescribed antibiotic medicine, finish all of it even if you start to feel better.  Rinse your mouth (gargle) often with salt water to relieve pain or swelling.  Do not drive or operate heavy machinery while taking pain medicine.  Do not apply heat to the outside of your mouth.  Keep all follow-up visits as directed by your dentist. This is important. SEEK MEDICAL  CARE IF:  Your pain is worse and is not helped by medicine. SEEK IMMEDIATE MEDICAL CARE IF:  You have a fever or chills.  Your symptoms suddenly get worse.  You have a very bad headache.  You have problems breathing or swallowing.  You have trouble opening your mouth.  You have swelling in your neck or around your eye.   This information is not intended to replace advice given to you by your health care provider. Make sure you discuss any questions you have with your health care provider.   Document Released: 12/27/2004 Document Revised: 05/13/2014 Document Reviewed: 12/24/2013 Elsevier Interactive Patient Education [employer] Elsevier Inc.

## 2015-04-02 ENCOUNTER — Emergency Department (HOSPITAL_COMMUNITY)
Admission: EM | Admit: 2015-04-02 | Discharge: 2015-04-02 | Disposition: A | Discharge status: Home / Self Care | Payer: BC Managed Care – PPO | Attending: Physician Assistant | Admitting: Physician Assistant

## 2015-04-02 ENCOUNTER — Encounter (HOSPITAL_COMMUNITY): Payer: Self-pay

## 2015-04-02 DIAGNOSIS — G8929 Other chronic pain: Secondary | ICD-10-CM | POA: Insufficient documentation

## 2015-04-02 DIAGNOSIS — Z792 Long term (current) use of antibiotics: Secondary | ICD-10-CM | POA: Insufficient documentation

## 2015-04-02 DIAGNOSIS — K047 Periapical abscess without sinus: Secondary | ICD-10-CM | POA: Insufficient documentation

## 2015-04-02 DIAGNOSIS — Z791 Long term (current) use of non-steroidal anti-inflammatories (NSAID): Secondary | ICD-10-CM | POA: Insufficient documentation

## 2015-04-02 DIAGNOSIS — F17210 Nicotine dependence, cigarettes, uncomplicated: Secondary | ICD-10-CM | POA: Insufficient documentation

## 2015-04-02 DIAGNOSIS — Z8639 Personal history of other endocrine, nutritional and metabolic disease: Secondary | ICD-10-CM | POA: Insufficient documentation

## 2015-04-02 MED ORDER — PENICILLIN V POTASSIUM 500 MG PO TABS: 500.0000 mg | ORAL_TABLET | Freq: Three times a day (TID) | ORAL | Status: DC

## 2015-04-02 MED ORDER — NAPROXEN 500 MG PO TABS: 500.0000 mg | ORAL_TABLET | Freq: Two times a day (BID) | ORAL | Status: DC

## 2015-04-02 NOTE — Progress Notes (Signed)
Pt seen by [facility] staff  

## 2015-04-02 NOTE — ED Notes (Signed)
Pt woke up yesterday with pain and swelling in left side of face.  Does not know if coming from a tooth.  Taking tylenol without relief.  No fever.

## 2015-04-02 NOTE — Discharge Instructions (Signed)

## 2015-04-02 NOTE — ED Provider Notes (Signed)
CSN: [id number]     Arrival date & time 04/02/15  1116 History   First MD Initiated Contact with Patient 04/02/15 1156     Chief Complaint  Patient presents with  . Facial Swelling     (Consider location/radiation/quality/duration/timing/severity/associated sxs/prior Treatment) HPI   35 year old male presents with facial pain. Patient developed gradual onset of pain to the left side of his face since yesterday. He described pain as a sharp throbbing sensation, persistent and now is complaining of dental pain on the affected side. Pain is moderate in severity. He denies any fever, vision changes, pain with eye movement, ear pain, sore throat, or neck pain. He has been taking Tylenol without improvement. He denies any recent injury. He does not have a Education officer, communitydentist.  Past Medical History  Diagnosis Date  . Hypoglycemia   . Chronic pain    Past Surgical History  Procedure Laterality Date  . Knee surgery    . Hernia repair     History reviewed. No pertinent family history. Social History  Substance Use Topics  . Smoking status: Current Every Day Smoker -- 1.00 packs/day for 9 years    Types: Cigarettes  . Smokeless tobacco: Never Used  . Alcohol Use: No     Comment: social drinker     Review of Systems  Constitutional: Negative for fever.  HENT: Positive for dental problem. Negative for ear pain and sore throat.   Skin: Negative for wound.      Allergies  Review of patient's allergies indicates no known allergies.  Home Medications   Prior to Admission medications   Medication Sig Start Date End Date Taking? Authorizing Provider  cyclobenzaprine (FLEXERIL) 10 MG tablet Take 1 tablet (10 mg total) by mouth 2 (two) times daily as needed for muscle spasms. 05/11/14   Emilia BeckKaitlyn Szekalski, PA-C  HYDROcodone-acetaminophen (NORCO) 5-325 MG per tablet Take 1-2 tablets by mouth every 6 (six) hours as needed for moderate pain. 03/13/14   Arthor CaptainAbigail Harris, PA-C  ibuprofen (ADVIL,MOTRIN) 200 MG  tablet Take 200 mg by mouth every 6 (six) hours as needed.    Historical Provider, MD  lidocaine (LIDODERM) 5 % Place 1 patch onto the skin daily. Remove & Discard patch within 12 hours or as directed by MD 03/13/14   Arthor CaptainAbigail Harris, PA-C  meloxicam (MOBIC) 15 MG tablet Take 1 tablet (15 mg total) by mouth daily. 05/11/14   Kaitlyn Szekalski, PA-C  metroNIDAZOLE (FLAGYL) 500 MG tablet Take 1 tablet (500 mg total) by mouth 2 (two) times daily. 12/13/13   Gerhard Munchobert Lockwood, MD  naproxen (NAPROSYN) 500 MG tablet Take 1 tablet (500 mg total) by mouth 2 (two) times daily. 12/16/14   Tatyana Kirichenko, PA-C  ondansetron (ZOFRAN-ODT) 8 MG disintegrating tablet Take 1 tablet (8 mg total) by mouth once. 12/13/13   Gerhard Munchobert Lockwood, MD   BP 129/99 mmHg  Pulse 76  Temp(Src) 98.1 F (36.7 C) (Oral)  Resp 15  SpO2 100% Physical Exam  Constitutional: He appears well-developed and well-nourished. No distress.  Caucasian male nontoxic in appearance  HENT:  Head: Atraumatic.  Face: Moderate swelling noted to left zygomatic arch and left cheek tender to palpation without any fluctuance and no skin erythema. No signs of oral cellulitis.  Oral cavity: Significant dental decay noted throughout mouth with tenderness along left upper gum on palpation but no obvious abscess amenable for drainage. No trismus.  Throat: Uvula is midline no tonsillar enlargement or exudates.  Eyes: Conjunctivae and EOM are normal. Pupils are  equal, round, and reactive to light.  Neck: Neck supple.  Lymphadenopathy:    He has no cervical adenopathy.  Neurological: He is alert.  Skin: No rash noted.  Psychiatric: He has a normal mood and affect.  Nursing note and vitals reviewed.   ED Course  Procedures (including critical care time) Labs Review Labs Reviewed - No data to display  Imaging Review No results found. I have personally reviewed and evaluated these images and lab results as part of my medical decision-making.   EKG  Interpretation None      MDM   Final diagnoses:  Periapical abscess with facial involvement    BP 129/99 mmHg  Pulse 76  Temp(Src) 98.1 F (36.7 C) (Oral)  Resp 15  SpO2 100%   12:03 PM Patient with left-sided facial swelling consistence with periapical abscess with sinus involvement. I will provide NSAIDs, antibiotics, and we'll give dental referral. No evidence to suggest oral cellulitis or deep space infection.  Fayrene Helper, PA-C 04/02/15 1205  Courteney Lyn Mackuen, MD 04/02/15 1615

## 2015-09-14 ENCOUNTER — Emergency Department (HOSPITAL_COMMUNITY)
Admission: EM | Admit: 2015-09-14 | Discharge: 2015-09-15 | Disposition: A | Discharge status: Home / Self Care | Payer: Self-pay | Attending: Emergency Medicine | Admitting: Emergency Medicine

## 2015-09-14 ENCOUNTER — Encounter (HOSPITAL_COMMUNITY): Payer: Self-pay

## 2015-09-14 ENCOUNTER — Emergency Department (HOSPITAL_COMMUNITY): Payer: Self-pay

## 2015-09-14 DIAGNOSIS — F17210 Nicotine dependence, cigarettes, uncomplicated: Secondary | ICD-10-CM | POA: Insufficient documentation

## 2015-09-14 DIAGNOSIS — N410 Acute prostatitis: Secondary | ICD-10-CM | POA: Insufficient documentation

## 2015-09-14 LAB — URINE MICROSCOPIC-ADD ON: RBC / HPF: NONE SEEN RBC/hpf (ref 0–5)

## 2015-09-14 LAB — URINALYSIS, ROUTINE W REFLEX MICROSCOPIC
BILIRUBIN URINE: NEGATIVE
Glucose, UA: NEGATIVE mg/dL
Hgb urine dipstick: NEGATIVE
Ketones, ur: NEGATIVE mg/dL
NITRITE: NEGATIVE
PH: 6 (ref 5.0–8.0)
Protein, ur: NEGATIVE mg/dL
SPECIFIC GRAVITY, URINE: 1.009 (ref 1.005–1.030)

## 2015-09-14 LAB — COMPREHENSIVE METABOLIC PANEL
ALBUMIN: 3.8 g/dL (ref 3.5–5.0)
ALK PHOS: 83 U/L (ref 38–126)
ALT: 21 U/L (ref 17–63)
AST: 20 U/L (ref 15–41)
Anion gap: 6 (ref 5–15)
BILIRUBIN TOTAL: 0.4 mg/dL (ref 0.3–1.2)
BUN: 6 mg/dL (ref 6–20)
CO2: 23 mmol/L (ref 22–32)
Calcium: 8.9 mg/dL (ref 8.9–10.3)
Chloride: 107 mmol/L (ref 101–111)
Creatinine, Ser: 0.9 mg/dL (ref 0.61–1.24)
GFR calc Af Amer: >60 mL/min (ref >60)
GFR calc non Af Amer: >60 mL/min (ref >60)
GLUCOSE: 112 mg/dL — AB (ref 65–99)
POTASSIUM: 3.8 mmol/L (ref 3.5–5.1)
Sodium: 136 mmol/L (ref 135–145)
TOTAL PROTEIN: 5.9 g/dL — AB (ref 6.5–8.1)

## 2015-09-14 LAB — CBC
HEMATOCRIT: 43.9 % (ref 39.0–52.0)
HEMOGLOBIN: 14.8 g/dL (ref 13.0–17.0)
MCH: 30.4 pg (ref 26.0–34.0)
MCHC: 33.7 g/dL (ref 30.0–36.0)
MCV: 90.1 fL (ref 78.0–100.0)
Platelets: 229 10*3/uL (ref 150–400)
RBC: 4.87 MIL/uL (ref 4.22–5.81)
RDW: 12.5 % (ref 11.5–15.5)
WBC: 9 10*3/uL (ref 4.0–10.5)

## 2015-09-14 LAB — LIPASE, BLOOD: Lipase: 32 U/L (ref 11–51)

## 2015-09-14 MED ORDER — IOPAMIDOL (ISOVUE-300) INJECTION 61%
INTRAVENOUS | Status: AC
  Administered 2015-09-14: 100 mL
  Filled 2015-09-14: qty 100

## 2015-09-14 NOTE — ED Provider Notes (Signed)
[facility]-EMER[facility]ENCY DEPT Provider Note   CSN: [id number] Arrival date & time: 09/14/15  1817     History   Chief Complaint Chief Complaint  Patient presents with  . Abdominal Pain  . Dysuria    HPI Robert Quinn is a 35 y.o. male presenting with abdominal pain. Has had difficulty emptying his bladder for a few weeks to a month. Some dysuria at the end of urination. No blood or discharge. No frequency or urgency. No back pain, nausea or vomiting. Started having pain today. Abdominal pain is lower. Sometimes squeezing his penis relieves the discomfort. Has trouble getting out his ejaculate fully as well. No testicle pain or swelling.  HPI  Past Medical History:  Diagnosis Date  . Chronic pain   . Hypoglycemia     There are no active problems to display for this patient.   Past Surgical History:  Procedure Laterality Date  . HERNIA REPAIR    . KNEE SURGERY         Home Medications    Prior to Admission medications   Medication Sig Start Date End Date Taking? Authorizing Provider  sulfamethoxazole-trimethoprim (BACTRIM DS,SEPTRA DS) 800-160 MG tablet Take 1 tablet by mouth 2 (two) times daily. 09/15/15 09/22/15  Pricilla Loveless, MD    Family History History reviewed. No pertinent family history.  Social History Social History  Substance Use Topics  . Smoking status: Current Every Day Smoker    Packs/day: 1.00    Years: 9.00    Types: Cigarettes  . Smokeless tobacco: Never Used  . Alcohol use No     Allergies   Review of patient's allergies indicates no known allergies.   Review of Systems Review of Systems  Constitutional: Negative for fever.  Gastrointestinal: Positive for abdominal pain. Negative for vomiting.  Genitourinary: Positive for difficulty urinating. Negative for discharge, dysuria, penile pain, penile swelling, scrotal swelling and testicular pain.  Musculoskeletal: Negative for back pain.  All other systems reviewed and are  negative.    Physical Exam Updated Vital Signs BP 116/75   Pulse 72   Temp 98.1 F (36.7 C) (Oral)   Resp 18   Ht 5\' 10"  (1.778 m)   Wt 175 lb (79.4 kg)   SpO2 99%   BMI 25.11 kg/m   Physical Exam  Constitutional: He is oriented to person, place, and time. He appears well-developed and well-nourished.  HENT:  Head: Normocephalic and atraumatic.  Right Ear: External ear normal.  Left Ear: External ear normal.  Nose: Nose normal.  Eyes: Right eye exhibits no discharge. Left eye exhibits no discharge.  Neck: Neck supple.  Cardiovascular: Normal rate, regular rhythm and normal heart sounds.   Pulmonary/Chest: Effort normal and breath sounds normal.  Abdominal: Soft. There is tenderness in the right lower quadrant, suprapubic area and left lower quadrant.  Genitourinary: Penis normal. Prostate is tender. Right testis shows no swelling and no tenderness. Left testis shows no swelling.  Musculoskeletal: He exhibits no edema.  Neurological: He is alert and oriented to person, place, and time.  Skin: Skin is warm and dry.  Nursing note and vitals reviewed.    ED Treatments / Results  Labs (all labs ordered are listed, but only abnormal results are displayed) Labs Reviewed  COMPREHENSIVE METABOLIC PANEL - Abnormal; Notable for the following:       Result Value   Glucose, Bld 112 (*)    Total Protein 5.9 (*)    All other components within normal limits  URINALYSIS,  ROUTINE W REFLEX MICROSCOPIC (NOT AT [facility]) - Abnormal; Notable for the following:    Leukocytes, UA SMALL (*)    All other components within normal limits  URINE MICROSCOPIC-ADD ON - Abnormal; Notable for the following:    Squamous Epithelial / LPF 0-5 (*)    Bacteria, UA RARE (*)    All other components within normal limits  URINE CULTURE  LIPASE, BLOOD  CBC    EKG  EKG Interpretation None       Radiology Ct Abdomen Pelvis W Contrast  Result Date: 09/15/2015 CLINICAL DATA:  Acute onset of suprapubic  abdominal pain and mild tenderness on palpation. Increased urinary frequency and dysuria. Initial encounter. EXAM: CT ABDOMEN AND PELVIS WITH CONTRAST TECHNIQUE: Multidetector CT imaging of the abdomen and pelvis was performed using the standard protocol following bolus administration of intravenous contrast. CONTRAST:  100mL ISOVUE-300 IOPAMIDOL (ISOVUE-300) INJECTION 61% COMPARISON:  CT of the abdomen and pelvis performed 06/08/2010 FINDINGS: The visualized lung bases are clear. The liver and spleen are unremarkable in appearance. The gallbladder is within normal limits. The pancreas and adrenal glands are unremarkable. The kidneys are unremarkable in appearance. There is no evidence of hydronephrosis. No renal or ureteral stones are seen. No perinephric stranding is appreciated. No free fluid is identified. The small bowel is unremarkable in appearance. The stomach is within normal limits. No acute vascular abnormalities are seen. The appendix is normal in caliber, without evidence of appendicitis. The colon is grossly unremarkable in appearance. The bladder is mildly distended and grossly unremarkable. The prostate remains normal in size. No inguinal lymphadenopathy is seen. No acute osseous abnormalities are identified. IMPRESSION: Unremarkable contrast-enhanced CT of the abdomen and pelvis. Electronically Signed   By: Roanna RaiderJeffery  Chang M.D.   On: 09/15/2015 00:17    Procedures Procedures (including critical care time)  Medications Ordered in ED Medications  iopamidol (ISOVUE-300) 61 % injection (100 mLs  Contrast Given 09/14/15 2340)     Initial Impression / Assessment and Plan / ED Course  I have reviewed the triage vital signs and the nursing notes.  Pertinent labs & imaging results that were available during my care of the patient were reviewed by me and considered in my medical decision making (see chart for details).  Clinical Course  Comment By Time  Bladder scan at 130 mL. However is about  25-30 min after urinating. Will do a more immediate post-void residual. Given tenderness, get CT. If benign or no other cause, treat for prostatitis. Pricilla LovelessScott Gianfranco Araki, MD 09/04 2146  Post void residual immediately after peeing - 82 Pricilla LovelessScott Rilynne Lonsway, MD 09/04 2156    Patient CT is unremarkable. He did not necessarily jump off the bed but he did indicate that his prostate exam was more painful than his abdominal exam. At this point I think it is imperative to treat him for acute prostatitis. Will send urine for culture. Will treat empirically with Bactrim. I discussed the importance of following up with urology.  Final Clinical Impressions(s) / ED Diagnoses   Final diagnoses:  Acute prostatitis    New Prescriptions New Prescriptions   SULFAMETHOXAZOLE-TRIMETHOPRIM (BACTRIM DS,SEPTRA DS) 800-160 MG TABLET    Take 1 tablet by mouth 2 (two) times daily.     Pricilla LovelessScott Zakaiya Lares, MD 09/15/15 [phone]

## 2015-09-14 NOTE — ED Notes (Signed)
Pt c/o suprapubic pain, mild tenderness on palpation for a few days. Pt has urinary frequency with dysuria. Has been having normal BM with last one today

## 2015-09-14 NOTE — ED Triage Notes (Addendum)
Pt reports dysuria, feeling the urge to urinate but having trouble going. Pt also reports strong smelling urine. He also reports suprapubic pain with some nausea. Pt denies penile discharge.

## 2015-09-15 MED ORDER — SULFAMETHOXAZOLE-TRIMETHOPRIM 800-160 MG PO TABS: 1.0000 | ORAL_TABLET | Freq: Two times a day (BID) | ORAL | 0 refills | Status: AC

## 2015-09-16 LAB — URINE CULTURE: CULTURE: NO GROWTH

## 2016-05-31 ENCOUNTER — Emergency Department (HOSPITAL_COMMUNITY)
Admission: EM | Admit: 2016-05-31 | Discharge: 2016-05-31 | Disposition: A | Discharge status: Home / Self Care | Payer: Self-pay | Attending: Emergency Medicine | Admitting: Emergency Medicine

## 2016-05-31 DIAGNOSIS — F17210 Nicotine dependence, cigarettes, uncomplicated: Secondary | ICD-10-CM | POA: Insufficient documentation

## 2016-05-31 DIAGNOSIS — L739 Follicular disorder, unspecified: Secondary | ICD-10-CM | POA: Insufficient documentation

## 2016-05-31 DIAGNOSIS — N342 Other urethritis: Secondary | ICD-10-CM

## 2016-05-31 DIAGNOSIS — Z79899 Other long term (current) drug therapy: Secondary | ICD-10-CM | POA: Insufficient documentation

## 2016-05-31 DIAGNOSIS — N341 Nonspecific urethritis: Secondary | ICD-10-CM | POA: Insufficient documentation

## 2016-05-31 MED ORDER — METRONIDAZOLE 500 MG PO TABS
500.0000 mg | ORAL_TABLET | Freq: Once | ORAL | Status: AC
  Administered 2016-05-31: 500 mg via ORAL
  Filled 2016-05-31: qty 1

## 2016-05-31 MED ORDER — AZITHROMYCIN 250 MG PO TABS
1000.0000 mg | ORAL_TABLET | Freq: Once | ORAL | Status: AC
  Administered 2016-05-31: 1000 mg via ORAL
  Filled 2016-05-31: qty 4

## 2016-05-31 MED ORDER — METRONIDAZOLE 500 MG PO TABS: 500.0000 mg | ORAL_TABLET | Freq: Two times a day (BID) | ORAL | 0 refills | Status: DC

## 2016-05-31 MED ORDER — CHLORHEXIDINE GLUCONATE 2 % EX SOLN: 5.0000 mL | Freq: Two times a day (BID) | CUTANEOUS | 0 refills | Status: DC | PRN

## 2016-05-31 MED ORDER — CEFTRIAXONE SODIUM 250 MG IJ SOLR
250.0000 mg | Freq: Once | INTRAMUSCULAR | Status: AC
  Administered 2016-05-31: 250 mg via INTRAMUSCULAR
  Filled 2016-05-31: qty 250

## 2016-05-31 NOTE — ED Provider Notes (Signed)
[facility] Provider Note   CSN: [id number] Arrival date & time: 05/31/16  2052     History   Chief Complaint No chief complaint on file.   HPI Robert Quinn is a 36 y.o. male.  Pt presents to the ED today with his wife.  She has had dysuria and a vaginal d/c.  They have been "swinging."  Pt's wife also has BV.  Pt also noted that he has some lesions to his penis that come out when he sweats.  When he is at home or relaxed, they go away.   He denies urethral d/c or pain.      Past Medical History:  Diagnosis Date  . Chronic pain   . Hypoglycemia     There are no active problems to display for this patient.   Past Surgical History:  Procedure Laterality Date  . HERNIA REPAIR    . KNEE SURGERY         Home Medications    Prior to Admission medications   Medication Sig Start Date End Date Taking? Authorizing Provider  Chlorhexidine Gluconate 2 % SOLN Apply 5 mLs topically 2 (two) times daily as needed. 05/31/16   Jacalyn LefevreHaviland, Zyron Deeley, MD  metroNIDAZOLE (FLAGYL) 500 MG tablet Take 1 tablet (500 mg total) by mouth 2 (two) times daily. 05/31/16   Jacalyn LefevreHaviland, Rane Dumm, MD    Family History No family history on file.  Social History Social History  Substance Use Topics  . Smoking status: Current Every Day Smoker    Packs/day: 1.00    Years: 9.00    Types: Cigarettes  . Smokeless tobacco: Never Used  . Alcohol use No     Allergies   Patient has no known allergies.   Review of Systems Review of Systems  Genitourinary:       Penile lesions  All other systems reviewed and are negative.    Physical Exam Updated Vital Signs There were no vitals taken for this visit.  Physical Exam  Constitutional: He is oriented to person, place, and time. He appears well-developed and well-nourished.  HENT:  Head: Normocephalic and atraumatic.  Right Ear: External ear normal.  Left Ear: External ear normal.  Nose: Nose normal.  Mouth/Throat: Oropharynx is clear  and moist.  Eyes: Conjunctivae and EOM are normal. Pupils are equal, round, and reactive to light.  Neck: Normal range of motion. Neck supple.  Cardiovascular: Normal rate, regular rhythm and normal heart sounds.   Pulmonary/Chest: Effort normal and breath sounds normal.  Abdominal: Soft. Bowel sounds are normal.  Genitourinary:  Genitourinary Comments: Multiple small lesions to underside of penis.  Nonulcerative.  Not pustule.  Not vesicular.  Musculoskeletal: Normal range of motion.  Neurological: He is alert and oriented to person, place, and time.  Nursing note and vitals reviewed.    ED Treatments / Results  Labs (all labs ordered are listed, but only abnormal results are displayed) Labs Reviewed  GC/CHLAMYDIA PROBE AMP ([place]) NOT AT [facility]    EKG  EKG Interpretation None       Radiology No results found.  Procedures Procedures (including critical care time)  Medications Ordered in ED Medications  cefTRIAXone (ROCEPHIN) injection 250 mg (not administered)  azithromycin (ZITHROMAX) tablet 1,000 mg (not administered)  metroNIDAZOLE (FLAGYL) tablet 500 mg (not administered)     Initial Impression / Assessment and Plan / ED Course  I have reviewed the triage vital signs and the nursing notes.  Pertinent labs & imaging results that  were available during my care of the patient were reviewed by me and considered in my medical decision making (see chart for details).    Pt's lesions look like warts, but they go away when he is not sweating.  This argues against that.  Pt's wife also does not have any warts on her exam.  (I saw them both as patients).  Pt will be given chlorhexidine soln to clean the penis.  He is given the number of urology to f/u if they stay as they may need to be removed.   Pt also tx'd for GC, chl, and bv.  He knows to return if worse.  Final Clinical Impressions(s) / ED Diagnoses   Final diagnoses:  Urethritis  Folliculitis    New  Prescriptions New Prescriptions   CHLORHEXIDINE GLUCONATE 2 % SOLN    Apply 5 mLs topically 2 (two) times daily as needed.   METRONIDAZOLE (FLAGYL) 500 MG TABLET    Take 1 tablet (500 mg total) by mouth 2 (two) times daily.     Jacalyn Lefevre, MD 05/31/16 2113

## 2016-06-01 LAB — GC/CHLAMYDIA PROBE AMP (~~LOC~~) NOT AT ARMC
Chlamydia: POSITIVE — AB
Neisseria Gonorrhea: NEGATIVE

## 2016-06-16 ENCOUNTER — Ambulatory Visit: Payer: BC Managed Care – PPO | Admitting: Adult Health

## 2016-08-24 ENCOUNTER — Encounter: Payer: Self-pay | Admitting: Adult Health

## 2016-08-24 ENCOUNTER — Ambulatory Visit (INDEPENDENT_AMBULATORY_CARE_PROVIDER_SITE_OTHER): Payer: 59 | Admitting: Adult Health

## 2016-08-24 VITALS — BP 110/75 | HR 80 | Ht 68.5 in | Wt 189.1 lb

## 2016-08-24 DIAGNOSIS — F17200 Nicotine dependence, unspecified, uncomplicated: Secondary | ICD-10-CM | POA: Diagnosis not present

## 2016-08-24 DIAGNOSIS — F319 Bipolar disorder, unspecified: Secondary | ICD-10-CM

## 2016-08-24 DIAGNOSIS — Z0000 Encounter for general adult medical examination without abnormal findings: Secondary | ICD-10-CM | POA: Diagnosis not present

## 2016-08-24 DIAGNOSIS — Z202 Contact with and (suspected) exposure to infections with a predominantly sexual mode of transmission: Secondary | ICD-10-CM | POA: Diagnosis not present

## 2016-08-24 NOTE — Assessment & Plan Note (Signed)
Please talk to your psychiatrist about smoking cessation with - ask about Wellbutrin and/or Chantix.

## 2016-08-24 NOTE — Assessment & Plan Note (Signed)
Increase water intake, strive for at least 90 ounces/day. Follow heart healthy diet and reduce fast food. Please schedule nurse visit for fasting labs at your convenience. We will call you when urine/labs drawn today result. Please schedule complete physical this fall.

## 2016-08-24 NOTE — Assessment & Plan Note (Signed)
STI screening completed-will call when results are available. Discussed safe sex practices.

## 2016-08-24 NOTE — Assessment & Plan Note (Signed)
Dx'd > 4 months ago [facility]. Started on Olanzapine ? Mg and he stopped taking it >2 weeks ago due to SE (sedation/poor concentration).   Advised to return to psychiatrist to discuss treatment plan and discussed how it is not safe to d/c medication without talking to prescribing provider.
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
## 2016-08-24 NOTE — Patient Instructions (Signed)
Heart-Healthy Eating Plan Many factors influence your heart health, including eating and exercise habits. Heart (coronary) risk increases with abnormal blood fat (lipid) levels. Heart-healthy meal planning includes limiting unhealthy fats, increasing healthy fats, and making other small dietary changes. This includes maintaining a healthy body weight to help keep lipid levels within a normal range. What is my plan? Your health care provider recommends that you:  Get no more than __25__% of the total calories in your daily diet from fat.  Limit your intake of saturated fat to less than __5_% of your total calories each day.  Limit the amount of cholesterol in your diet to less than __300__ mg per day.  What types of fat should I choose?  Choose healthy fats more often. Choose monounsaturated and polyunsaturated fats, such as olive oil and canola oil, flaxseeds, walnuts, almonds, and seeds.  Eat more omega-3 fats. Good choices include salmon, mackerel, sardines, tuna, flaxseed oil, and ground flaxseeds. Aim to eat fish at least two times each week.  Limit saturated fats. Saturated fats are primarily found in animal products, such as meats, butter, and cream. Plant sources of saturated fats include palm oil, palm kernel oil, and coconut oil.  Avoid foods with partially hydrogenated oils in them. These contain trans fats. Examples of foods that contain trans fats are stick margarine, some tub margarines, cookies, crackers, and other baked goods. What general guidelines do I need to follow?  Check food labels carefully to identify foods with trans fats or high amounts of saturated fat.  Fill one half of your plate with vegetables and green salads. Eat 4-5 servings of vegetables per day. A serving of vegetables equals 1 cup of raw leafy vegetables,  cup of raw or cooked cut-up vegetables, or  cup of vegetable juice.  Fill one fourth of your plate with whole grains. Look for the word "whole" as  the first word in the ingredient list.  Fill one fourth of your plate with lean protein foods.  Eat 4-5 servings of fruit per day. A serving of fruit equals one medium whole fruit,  cup of dried fruit,  cup of fresh, frozen, or canned fruit, or  cup of 100% fruit juice.  Eat more foods that contain soluble fiber. Examples of foods that contain this type of fiber are apples, broccoli, carrots, beans, peas, and barley. Aim to get 20-30 g of fiber per day.  Eat more home-cooked food and less restaurant, buffet, and fast food.  Limit or avoid alcohol.  Limit foods that are high in starch and sugar.  Avoid fried foods.  Cook foods by using methods other than frying. Baking, boiling, grilling, and broiling are all great options. Other fat-reducing suggestions include: ? Removing the skin from poultry. ? Removing all visible fats from meats. ? Skimming the fat off of stews, soups, and gravies before serving them. ? Steaming vegetables in water or broth.  Lose weight if you are overweight. Losing just 5-10% of your initial body weight can help your overall health and prevent diseases such as diabetes and heart disease.  Increase your consumption of nuts, legumes, and seeds to 4-5 servings per week. One serving of dried beans or legumes equals  cup after being cooked, one serving of nuts equals 1 ounces, and one serving of seeds equals  ounce or 1 tablespoon.  You may need to monitor your salt (sodium) intake, especially if you have high blood pressure. Talk with your health care provider or dietitian to get  more information about reducing sodium. What foods can I eat? Grains  Breads, including [place], white, pita, wheat, raisin, rye, oatmeal, and [place]. Tortillas that are neither fried nor made with lard or trans fat. Low-fat rolls, including hotdog and hamburger buns and English muffins. Biscuits. Muffins. Waffles. Pancakes. Light popcorn. Whole-grain cereals. Flatbread. Melba toast.  Pretzels. Breadsticks. Rusks. Low-fat snacks and crackers, including oyster, saltine, matzo, graham, animal, and rye. Rice and pasta, including brown rice and those that are made with whole wheat. Vegetables All vegetables. Fruits All fruits, but limit coconut. Meats and Other Protein Sources Lean, well-trimmed beef, veal, pork, and lamb. Chicken and [place] without skin. All fish and shellfish. Wild duck, rabbit, pheasant, and venison. Egg whites or low-cholesterol egg substitutes. Dried beans, peas, lentils, and tofu.Seeds and most nuts. Dairy Low-fat or nonfat cheeses, including ricotta, string, and mozzarella. Skim or 1% milk that is liquid, powdered, or evaporated. Buttermilk that is made with low-fat milk. Nonfat or low-fat yogurt. Beverages Mineral water. Diet carbonated beverages. Sweets and Desserts Sherbets and fruit ices. Honey, jam, marmalade, jelly, and syrups. Meringues and gelatins. Pure sugar candy, such as hard candy, jelly beans, gumdrops, mints, marshmallows, and small amounts of dark chocolate. [employer] Eat all sweets and desserts in moderation. Fats and Oils Nonhydrogenated (trans-free) margarines. Vegetable oils, including soybean, sesame, sunflower, olive, peanut, safflower, corn, canola, and cottonseed. Salad dressings or mayonnaise that are made with a vegetable oil. Limit added fats and oils that you use for cooking, baking, salads, and as spreads. Other Cocoa powder. Coffee and tea. All seasonings and condiments. The items listed above may not be a complete list of recommended foods or beverages. Contact your dietitian for more options. What foods are not recommended? Grains Breads that are made with saturated or trans fats, oils, or whole milk. Croissants. Butter rolls. Cheese breads. Sweet rolls. Donuts. Buttered popcorn. Chow mein noodles. High-fat crackers, such as cheese or butter crackers. Meats and Other Protein Sources Fatty meats, such as hotdogs,  short ribs, sausage, spareribs, bacon, ribeye roast or steak, and mutton. High-fat deli meats, such as salami and bologna. Caviar. Domestic duck and goose. Organ meats, such as kidney, liver, sweetbreads, brains, gizzard, chitterlings, and heart. Dairy Cream, sour cream, cream cheese, and creamed cottage cheese. Whole milk cheeses, including blue (bleu), Monterey Jack, [place], [place], American, [place], Swiss, [place], [place], and [place]. Whole or 2% milk that is liquid, evaporated, or condensed. Whole buttermilk. Cream sauce or high-fat cheese sauce. Yogurt that is made from whole milk. Beverages Regular sodas and drinks with added sugar. Sweets and Desserts Frosting. Pudding. Cookies. Cakes other than angel food cake. Candy that has milk chocolate or white chocolate, hydrogenated fat, butter, coconut, or unknown ingredients. Buttered syrups. Full-fat ice cream or ice cream drinks. Fats and Oils Gravy that has suet, meat fat, or shortening. Cocoa butter, hydrogenated oils, palm oil, coconut oil, palm kernel oil. These can often be found in baked products, candy, fried foods, nondairy creamers, and whipped toppings. Solid fats and shortenings, including bacon fat, salt pork, lard, and butter. Nondairy cream substitutes, such as coffee creamers and sour cream substitutes. Salad dressings that are made of unknown oils, cheese, or sour cream. The items listed above may not be a complete list of foods and beverages to avoid. Contact your dietitian for more information. This information is not intended to replace advice given to you by your health care provider. Make sure you discuss any questions you have with your health care  provider. Document Released: 10/06/2007 Document Revised: 07/17/2015 Document Reviewed: 06/20/2013 Elsevier Interactive Patient Education  2017 [employer].   Tobacco Use Disorder Tobacco use disorder (TUD) is a mental disorder. It is the long-term use of tobacco in spite of  related health problems or difficulty with normal life activities. Tobacco is most commonly smoked as cigarettes and less commonly as cigars or pipes. Smokeless chewing tobacco and snuff are also popular. People with TUD get a feeling of extreme pleasure (euphoria) from using tobacco and have a desire to use it again and again. Repeated use of tobacco can cause problems. The addictive effects of tobacco are due mainly tothe ingredient nicotine. Nicotine also causes a rush of adrenaline (epinephrine) in the body. This leads to increased blood pressure, heart rate, and breathing rate. These changes may cause problems for people with high blood pressure, weak hearts, or lung disease. High doses of nicotine in children and pets can lead to seizures and death. Tobacco contains a number of other unsafe chemicals. These chemicals are especially harmful when inhaled as smoke and can damage almost every organ in the body. Smokers live shorter lives than nonsmokers and are at risk of dying from a number of diseases and cancers. Tobacco smoke can also cause health problems for nonsmokers (due to inhaling secondhand smoke). Smoking is also a fire hazard. TUD usually starts in the late teenage years and is most common in young adults between the ages of 29 and 66 years. People who start smoking earlier in life are more likely to continue smoking as adults. TUD is somewhat more common in men than women. People with TUD are at higher risk for using alcohol and other drugs of abuse. What increases the risk? Risk factors for TUD include:  Having family members with the disorder.  Being around people who use tobacco.  Having an existing mental health issue such as schizophrenia, depression, bipolar disorder, ADHD, or posttraumatic stress disorder (PTSD).  What are the signs or symptoms? People with tobacco use disorder have two or more of the following signs and symptoms within 12 months:  Use of more tobacco over a  longer period than intended.  Not able to cut down or control tobacco use.  A lot of time spent obtaining or using tobacco.  Strong desire or urge to use tobacco (craving). Cravings may last for 6 months or longer after quitting.  Use of tobacco even when use leads to major problems at work, school, or home.  Use of tobacco even when use leads to relationship problems.  Giving up or cutting down on important life activities because of tobacco use.  Repeatedly using tobacco in situations where it puts you or others in physical danger, like smoking in bed.  Use of tobacco even when it is known that a physical or mental problem is likely related to tobacco use. ? Physical problems are numerous and may include chronic bronchitis, emphysema, lung and other cancers, gum disease, high blood pressure, heart disease, and stroke. ? Mental problems caused by tobacco may include difficulty sleeping and anxiety.  Need to use greater amounts of tobacco to get the same effect. This means you have developed a tolerance.  Withdrawal symptoms as a result of stopping or rapidly cutting back use. These symptoms may last a month or more after quitting and include the following: ? Depressed, anxious, or irritable mood. ? Difficulty concentrating. ? Increased appetite. ? Restlessness or trouble sleeping. ? Use of tobacco to avoid withdrawal symptoms.  How is this diagnosed? Tobacco use disorder is diagnosed by your health care provider. A diagnosis may be made by:  Your health care provider asking questions about your tobacco use and any problems it may be causing.  A physical exam.  Lab tests.  You may be referred to a mental health professional or addiction specialist.  The severity of tobacco use disorder depends on the number of signs and symptoms you have:  Mild-Two or three symptoms.  Moderate-Four or five symptoms.  Severe-Six or more symptoms.  How is this treated? Many people with  tobacco use disorder are unable to quit on their own and need help. Treatment options include the following:  Nicotine replacement therapy (NRT). NRT provides nicotine without the other harmful chemicals in tobacco. NRT gradually lowers the dosage of nicotine in the body and reduces withdrawal symptoms. NRT is available in over-the-counter forms (gum, lozenges, and skin patches) as well as prescription forms (mouth inhaler and nasal spray).  Medicines.This may include: ? Antidepressant medicine that may reduce nicotine cravings. ? A medicine that acts on nicotine receptors in the brain to reduce cravings and withdrawal symptoms. It may also block the effects of tobacco in people with TUD who relapse.  Counseling or talk therapy. A form of talk therapy called behavioral therapy is commonly used to treat people with TUD. Behavioral therapy looks at triggers for tobacco use, how to avoid them, and how to cope with cravings. It is most effective in person or by phone but is also available in self-help forms (books and Internet websites).  Support groups. These provide emotional support, advice, and guidance for quitting tobacco.  The most effective treatment for TUD is usually a combination of medicine, talk therapy, and support groups. Follow these instructions at home:  Keep all follow-up visits as directed by your health care provider. This is important.  Take medicines only as directed by your health care provider.  Check with your health care provider before starting new prescription or over-the-counter medicines. Contact a health care provider if:  You are not able to take your medicines as prescribed.  Treatment is not helping your TUD and your symptoms get worse. Get help right away if:  You have serious thoughts about hurting yourself or others.  You have trouble breathing, chest pain, sudden weakness, or sudden numbness in part of your body. This information is not intended to  replace advice given to you by your health care provider. Make sure you discuss any questions you have with your health care provider. Document Released: 09/02/2003 Document Revised: 08/30/2015 Document Reviewed: 02/22/2013 Elsevier Interactive Patient Education  2018 [employer].   Increase water intake, strive for at least 90 ounces/day. Please talk to your psychiatrist about smoking cessation with - ask about Wellbutrin and/or Chantix. Follow heart healthy diet and reduce fast food. Please schedule nurse visit for fasting labs at your convenience. We will call you when urine/labs drawn today result. Please schedule complete physical this fall. WELCOME TO THE PRACTICE!

## 2016-08-25 LAB — HIV ANTIBODY (ROUTINE TESTING W REFLEX): HIV Screen 4th Generation wRfx: NONREACTIVE

## 2016-08-25 LAB — HEPATITIS C ANTIBODY

## 2016-08-25 LAB — SPECIMEN STATUS REPORT

## 2016-08-25 LAB — RPR: RPR Ser Ql: NONREACTIVE

## 2016-08-27 LAB — SPECIMEN STATUS REPORT

## 2016-08-27 LAB — GC/CHLAMYDIA PROBE AMP
Chlamydia trachomatis, NAA: NEGATIVE
Neisseria gonorrhoeae by PCR: NEGATIVE

## 2016-08-27 LAB — HSV(HERPES SMPLX)ABS-I+II(IGG+IGM)-BLD
HSV 2 IgG, Type Spec: 12.7 index — ABNORMAL HIGH (ref 0.00–0.90)
HSVI/II Comb IgM: 1.68 Ratio — ABNORMAL HIGH (ref 0.00–0.90)

## 2016-09-05 ENCOUNTER — Telehealth: Payer: Self-pay | Admitting: Adult Health

## 2016-09-05 NOTE — Telephone Encounter (Signed)
Pt informed verbally to schedule OV with Katy to discuss results.  Pt transferred to front desk.  Tiajuana Amass, CMA

## 2016-09-05 NOTE — Telephone Encounter (Signed)
Patient wants to speak to someone about lab work

## 2016-09-07 ENCOUNTER — Emergency Department (HOSPITAL_COMMUNITY): Payer: Worker's Compensation

## 2016-09-07 ENCOUNTER — Emergency Department (HOSPITAL_COMMUNITY)
Admission: EM | Admit: 2016-09-07 | Discharge: 2016-09-07 | Disposition: A | Discharge status: Other Acute Inpatient Hospital | Payer: Worker's Compensation | Attending: Emergency Medicine | Admitting: Emergency Medicine

## 2016-09-07 ENCOUNTER — Encounter (HOSPITAL_COMMUNITY): Payer: Self-pay | Admitting: *Deleted

## 2016-09-07 ENCOUNTER — Emergency Department (HOSPITAL_COMMUNITY)
Admission: EM | Admit: 2016-09-07 | Discharge: 2016-09-07 | Disposition: A | Discharge status: Home / Self Care | Payer: 59 | Attending: Emergency Medicine | Admitting: Emergency Medicine

## 2016-09-07 DIAGNOSIS — Y9289 Other specified places as the place of occurrence of the external cause: Secondary | ICD-10-CM | POA: Insufficient documentation

## 2016-09-07 DIAGNOSIS — Y939 Activity, unspecified: Secondary | ICD-10-CM | POA: Insufficient documentation

## 2016-09-07 DIAGNOSIS — Y929 Unspecified place or not applicable: Secondary | ICD-10-CM | POA: Insufficient documentation

## 2016-09-07 DIAGNOSIS — Y9389 Activity, other specified: Secondary | ICD-10-CM | POA: Insufficient documentation

## 2016-09-07 DIAGNOSIS — S0592XA Unspecified injury of left eye and orbit, initial encounter: Secondary | ICD-10-CM | POA: Insufficient documentation

## 2016-09-07 DIAGNOSIS — T1502XA Foreign body in cornea, left eye, initial encounter: Secondary | ICD-10-CM | POA: Diagnosis not present

## 2016-09-07 DIAGNOSIS — S0532XA Ocular laceration without prolapse or loss of intraocular tissue, left eye, initial encounter: Secondary | ICD-10-CM | POA: Diagnosis not present

## 2016-09-07 DIAGNOSIS — T1592XA Foreign body on external eye, part unspecified, left eye, initial encounter: Secondary | ICD-10-CM | POA: Diagnosis not present

## 2016-09-07 DIAGNOSIS — F17210 Nicotine dependence, cigarettes, uncomplicated: Secondary | ICD-10-CM | POA: Insufficient documentation

## 2016-09-07 DIAGNOSIS — S0552XA Penetrating wound with foreign body of left eyeball, initial encounter: Secondary | ICD-10-CM | POA: Diagnosis not present

## 2016-09-07 DIAGNOSIS — H5712 Ocular pain, left eye: Secondary | ICD-10-CM | POA: Diagnosis not present

## 2016-09-07 DIAGNOSIS — X58XXXA Exposure to other specified factors, initial encounter: Secondary | ICD-10-CM | POA: Insufficient documentation

## 2016-09-07 DIAGNOSIS — Y999 Unspecified external cause status: Secondary | ICD-10-CM | POA: Diagnosis not present

## 2016-09-07 DIAGNOSIS — Y990 Civilian activity done for income or pay: Secondary | ICD-10-CM | POA: Diagnosis not present

## 2016-09-07 DIAGNOSIS — S0531XA Ocular laceration without prolapse or loss of intraocular tissue, right eye, initial encounter: Secondary | ICD-10-CM | POA: Diagnosis not present

## 2016-09-07 DIAGNOSIS — H33302 Unspecified retinal break, left eye: Secondary | ICD-10-CM | POA: Diagnosis not present

## 2016-09-07 DIAGNOSIS — W208XXA Other cause of strike by thrown, projected or falling object, initial encounter: Secondary | ICD-10-CM | POA: Insufficient documentation

## 2016-09-07 DIAGNOSIS — S0522XA Ocular laceration and rupture with prolapse or loss of intraocular tissue, left eye, initial encounter: Secondary | ICD-10-CM | POA: Diagnosis not present

## 2016-09-07 LAB — CBC WITH DIFFERENTIAL/PLATELET
Basophils Absolute: 0 10*3/uL (ref 0.0–0.1)
Basophils Relative: 0 %
Eosinophils Absolute: 0.1 10*3/uL (ref 0.0–0.7)
Eosinophils Relative: 1 %
HCT: 45 % (ref 39.0–52.0)
Hemoglobin: 16 g/dL (ref 13.0–17.0)
Lymphocytes Relative: 16 %
Lymphs Abs: 2 10*3/uL (ref 0.7–4.0)
MCH: 31.4 pg (ref 26.0–34.0)
MCHC: 35.6 g/dL (ref 30.0–36.0)
MCV: 88.4 fL (ref 78.0–100.0)
Monocytes Absolute: 0.9 10*3/uL (ref 0.1–1.0)
Monocytes Relative: 7 %
Neutro Abs: 9.7 10*3/uL — ABNORMAL HIGH (ref 1.7–7.7)
Neutrophils Relative %: 76 %
Platelets: 202 10*3/uL (ref 150–400)
RBC: 5.09 MIL/uL (ref 4.22–5.81)
RDW: 12.8 % (ref 11.5–15.5)
WBC: 12.7 10*3/uL — ABNORMAL HIGH (ref 4.0–10.5)

## 2016-09-07 LAB — BASIC METABOLIC PANEL
Anion gap: 7 (ref 5–15)
BUN: 7 mg/dL (ref 6–20)
CO2: 23 mmol/L (ref 22–32)
Calcium: 9.1 mg/dL (ref 8.9–10.3)
Chloride: 105 mmol/L (ref 101–111)
Creatinine, Ser: 0.92 mg/dL (ref 0.61–1.24)
GFR calc Af Amer: >60 mL/min (ref >60)
GFR calc non Af Amer: >60 mL/min (ref >60)
Glucose, Bld: 115 mg/dL — ABNORMAL HIGH (ref 65–99)
Potassium: 3.8 mmol/L (ref 3.5–5.1)
Sodium: 135 mmol/L (ref 135–145)

## 2016-09-07 LAB — PROTIME-INR
INR: 0.93
Prothrombin Time: 12.4 seconds (ref 11.4–15.2)

## 2016-09-07 MED ORDER — TETANUS-DIPHTH-ACELL PERTUSSIS 5-2.5-18.5 LF-MCG/0.5 IM SUSP: 0.5000 mL | Freq: Once | INTRAMUSCULAR | Status: DC

## 2016-09-07 MED ORDER — SODIUM CHLORIDE 0.9 % IV SOLN
Freq: Once | INTRAVENOUS | Status: AC
  Administered 2016-09-07: 125 mL/h via INTRAVENOUS

## 2016-09-07 MED ORDER — SODIUM CHLORIDE 0.9 % IV SOLN: INTRAVENOUS | Status: DC

## 2016-09-07 MED ORDER — GATIFLOXACIN 0.5 % OP SOLN
1.0000 [drp] | Freq: Once | OPHTHALMIC | Status: AC
  Administered 2016-09-07: 1 [drp] via OPHTHALMIC
  Filled 2016-09-07: qty 2.5

## 2016-09-07 MED ORDER — NICOTINE 14 MG/24HR TD PT24
14.0000 mg | MEDICATED_PATCH | Freq: Once | TRANSDERMAL | Status: DC
  Administered 2016-09-07: 14 mg via TRANSDERMAL
  Filled 2016-09-07: qty 1

## 2016-09-07 MED ORDER — MORPHINE SULFATE (PF) 4 MG/ML IV SOLN
4.0000 mg | Freq: Once | INTRAVENOUS | Status: AC
  Administered 2016-09-07: 4 mg via INTRAVENOUS
  Filled 2016-09-07: qty 1

## 2016-09-07 MED ORDER — LORAZEPAM 2 MG/ML IJ SOLN: 1.0000 mg | Freq: Once | INTRAMUSCULAR | Status: DC

## 2016-09-07 MED ORDER — CEFAZOLIN SODIUM-DEXTROSE 1-4 GM/50ML-% IV SOLN
1.0000 g | Freq: Once | INTRAVENOUS | Status: AC
  Administered 2016-09-07: 1 g via INTRAVENOUS
  Filled 2016-09-07: qty 50

## 2016-09-07 MED ORDER — FLUORESCEIN SODIUM 0.6 MG OP STRP
1.0000 | ORAL_STRIP | Freq: Once | OPHTHALMIC | Status: AC
  Administered 2016-09-07: 1 via OPHTHALMIC
  Filled 2016-09-07: qty 1

## 2016-09-07 MED ORDER — TETRACAINE HCL 0.5 % OP SOLN
1.0000 [drp] | Freq: Once | OPHTHALMIC | Status: AC
  Administered 2016-09-07: 1 [drp] via OPHTHALMIC
  Filled 2016-09-07: qty 4

## 2016-09-07 NOTE — ED Triage Notes (Signed)
[facility] due to left eye injury. Was sent for appt with dr Harvel Qualeabugo which then sent him back to [facility] ED for CT scan to r/o posterior foreign body and schedule surgery due to ruptured globe.

## 2016-09-07 NOTE — ED Notes (Signed)
[facility] reviewed by this charge RN, physician certificate expired and needs renewal prior to transfer.

## 2016-09-07 NOTE — ED Notes (Signed)
Pt. Declines d/c v.s. Abbie, our P.A. Spends much time adjuring pt. To go immediately to the ophthalmologist, and explaining the critical importance of same. Pt. Assures us he will go straight there. He leaves with his significant other.

## 2016-09-07 NOTE — ED Provider Notes (Signed)
36 year old male was seen at [facility] earlier today after injury to his left eye.  Please see previous providers note for full H&P.  Patient with ruptured left globe.  He followed up immediately with ophthalmology.  He was seen by Dr. Harvel Quale.  Patient was sent to the emergency room for immediate orbital CT to rule out posterior foreign body and will be scheduled for urgent globe rupture repair.  Upon my assessment patient resting comfortably in exam bed.  He has both eyes closed, reports that the light hurts his left eye.  Rather than putting patient through a full exam after he has had one by both the emergency room and ophthalmology I did not perform physical exam.  IV will be inserted, pain medication will be given, CT scan will be ordered.  Patient will be monitored here until return of CT scan pending surgery.  CT scan return.  I spoke with Dr.Abugo.  Findings on CT scan showed need for retinal specialist which we do not have at [facility].  I consulted ophthalmology at [facility] who agrees to accept patient in transfer.  Patient transferred to [facility] for further evaluation and management.  Vitals:   09/07/16 1921 09/07/16 2004  BP:  102/65  Pulse:  71  Resp: 16 16  Temp: 98.6 F (37 C) 98.6 F (37 C)  SpO2:  96%      Eyvonne Mechanic, PA-C 09/07/16 2026    Vanetta Mulders, MD 09/14/16 [phone]

## 2016-09-07 NOTE — ED Provider Notes (Signed)
[facility]-EMERGENCY DEPT Provider Note   CSN: [id number] Arrival date & time: 09/07/16  0903     History   Chief Complaint Chief Complaint  Patient presents with  . 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 The patient presents emergency Department with chief complaint of life. I injury. Patient states that today he was working at his garage changing outer part required some force and effort. Appearing fluid off and hit him in the left eye. He had immediate severe pain, watering, photosensitivity. He complains of upper  visual field change. He denies vision loss. He is unsure of his last tetanus vaccination.  HPI  Past Medical History:  Diagnosis Date  . Chronic pain   . Hypoglycemia     Patient Active Problem List   Diagnosis Date Noted  . Exposure to sexually transmitted disease (STD) 08/24/2016  . Healthcare maintenance 08/24/2016  . Tobacco use disorder 08/24/2016  . Bipolar affective disorder (HCC) 08/24/2016    Past Surgical History:  Procedure Laterality Date  . HERNIA REPAIR    . KNEE SURGERY         Home Medications    Prior to Admission medications   Medication Sig Start Date End Date Taking? Authorizing Provider  OLANZapine (ZYPREXA) 10 MG tablet Take 10 mg by mouth at bedtime.   Yes [provider]    Family History Family History  Problem Relation Age of Onset  . Heart disease Father   . Heart attack Father   . Stroke Father   . Heart Problems Sister   . Healthy Brother   . Healthy Brother   . Healthy Brother     Social History Social History  Substance Use Topics  . Smoking status: Current Every Day Smoker    Packs/day: 1.00    Years: 17.00    Types: Cigarettes  . Smokeless tobacco: Never Used  . Alcohol use No     Allergies   Patient has no known allergies.   Review of Systems Review of Systems Ten systems reviewed and are negative for acute change, except as noted in the HPI.   Physical  Exam Updated Vital Signs BP 109/83 (BP Location: Left Arm)   Pulse 95   Temp 98.4 F (36.9 C) (Oral)   Resp 16   SpO2 97%   Physical Exam  Constitutional: He appears well-developed and well-nourished. No distress.  HENT:  Head: Normocephalic and atraumatic.  Eyes: No scleral icterus.  Patient with injected conjunctiva The left eye slit lamp reveals embedded metal in the upper portion of the patient's cornea this appears to be tracking backward and I have concern for perforation through the iris. Negative Seidel sign  Neck: Normal range of motion. Neck supple.  Cardiovascular: Normal rate, regular rhythm and normal heart sounds.   Pulmonary/Chest: Effort normal and breath sounds normal. No respiratory distress.  Abdominal: Soft. There is no tenderness.  Musculoskeletal: He exhibits no edema.  Neurological: He is alert.  Skin: Skin is warm and dry. He is not diaphoretic.  Psychiatric: His behavior is normal.  Nursing note and vitals reviewed.    ED Treatments / Results  Labs (all labs ordered are listed, but only abnormal results are displayed) Labs Reviewed - No data to display  EKG  EKG Interpretation None       Radiology No results found.  Procedures Procedures (including critical care time)  Medications Ordered in ED Medications  tetracaine (PONTOCAINE)  0.5 % ophthalmic solution 1 drop (not administered)  fluorescein ophthalmic strip 1 strip (not administered)     Initial Impression / Assessment and Plan / ED Course  I have reviewed the triage vital signs and the nursing notes.  Pertinent labs & imaging results that were available during my care of the patient were reviewed by me and considered in my medical decision making (see chart for details).     Patient with foreign body and corneal abrasion in the eye. Started onantibiotic eyedrops. I discussed that the case with Dr.Abugo who asks that the patient come over to his office immediately. Patient will  be driven by his wife.   Final Clinical Impressions(s) / ED Diagnoses   Final diagnoses:  Foreign body in eye, left, initial encounter    New Prescriptions New Prescriptions   No medications on file     Arthor CaptainHarris, Cigi Bega, PA-C 09/08/16 1603    Raeford RazorKohut, Stephen, MD 09/14/16 [phone]

## 2016-09-07 NOTE — ED Notes (Signed)
[facility]

## 2016-09-07 NOTE — ED Triage Notes (Signed)
Pt states he was working this morning and had something hit him in the L eye. Alert and oriented. Blurry vision.

## 2016-09-07 NOTE — ED Notes (Signed)
[facility]

## 2016-09-08 DIAGNOSIS — S0552XA Penetrating wound with foreign body of left eyeball, initial encounter: Secondary | ICD-10-CM | POA: Insufficient documentation

## 2016-09-08 DIAGNOSIS — S0532XA Ocular laceration without prolapse or loss of intraocular tissue, left eye, initial encounter: Secondary | ICD-10-CM | POA: Diagnosis not present

## 2016-09-13 ENCOUNTER — Ambulatory Visit (INDEPENDENT_AMBULATORY_CARE_PROVIDER_SITE_OTHER): Payer: 59 | Admitting: Adult Health

## 2016-09-13 ENCOUNTER — Encounter: Payer: Self-pay | Admitting: Adult Health

## 2016-09-13 DIAGNOSIS — B009 Herpesviral infection, unspecified: Secondary | ICD-10-CM | POA: Diagnosis not present

## 2016-09-13 NOTE — Assessment & Plan Note (Signed)
Discussed at length lab results. Discussed transmission risks. Discussed antiviral options, he declined any rx treatment. HSV2 Information hand out provided. After verbal consent given, wife present for STI screening results. F/u as needed.

## 2016-09-13 NOTE — Patient Instructions (Signed)

## 2016-09-13 NOTE — Progress Notes (Signed)
Subjective:    Patient ID: Robert Quinn, male    DOB: [birth year], 36 y.o.   MRN: [id number]  HPI:  Robert Quinn is here for f/u: STI screening. [facility] and he gave verbal permission that she can be present during OV. He and his wife have engaged in unprotected sex with other couples and were both treated for chlamydia May 2018.  He reports "spots on my penis that pop up when I get hot, then they go away".  He first noticed this August 2017 after a trip to the beach.  He denies penile d/c or pain in genital area.  He denies known hx of HSV infection. Of Note:  He sustained ruptured L globe from a work injury.  [facility].  He reports vision is slowly improving and has not worked since DOI-09/07/16.  Patient Care Team    Relationship Specialty Notifications Start End  Julaine Fusianford, Katy D, NP PCP - General Family Medicine  08/16/16     Patient Active Problem List   Diagnosis Date Noted  . HSV-2 (herpes simplex virus 2) infection 09/13/2016  . Exposure to sexually transmitted disease (STD) 08/24/2016  . Healthcare maintenance 08/24/2016  . Tobacco use disorder 08/24/2016  . Bipolar affective disorder (HCC) 08/24/2016     Past Medical History:  Diagnosis Date  . Chronic pain   . Hypoglycemia      Past Surgical History:  Procedure Laterality Date  . HERNIA REPAIR    . KNEE SURGERY       Family History  Problem Relation Age of Onset  . Heart disease Father   . Heart attack Father   . Stroke Father   . Heart Problems Sister   . Healthy Brother   . Healthy Brother   . Healthy Brother      History  Drug Use No     History  Alcohol Use No     History  Smoking Status  . Current Every Day Smoker  . Packs/day: 1.00  . Years: 17.00  . Types: Cigarettes  Smokeless Tobacco  . Never Used     No outpatient encounter prescriptions on file as of 09/13/2016.   No facility-administered encounter medications on file as of  09/13/2016.     Allergies: Patient has no known allergies.  Body mass index is 28.38 kg/m.  Blood pressure 116/81, pulse (!) 109, height 5' 8.5" (1.74 m), weight 189 lb 6.4 oz (85.9 kg).     Review of Systems  Constitutional: Positive for fatigue.  HENT: Negative.   Eyes: Positive for photophobia, pain, redness and visual disturbance. Negative for discharge.  Respiratory: Negative.   Cardiovascular: Negative.   Gastrointestinal: Negative.   Endocrine: Negative.   Genitourinary: Positive for genital sores. Negative for decreased urine volume, hematuria, penile pain, penile swelling, scrotal swelling, testicular pain and urgency.  Musculoskeletal: Negative.   Skin: Negative.   Allergic/Immunologic: Negative.   Neurological: Negative.   Hematological: Negative.   Psychiatric/Behavioral: Positive for dysphoric mood and sleep disturbance. The patient is not nervous/anxious.        Objective:   Physical Exam  Constitutional: He appears well-developed and well-nourished. No distress.  HENT:  Head: Normocephalic.  Right Ear: External ear normal.  Left Ear: External ear normal.  Eyes: Right conjunctiva has no hemorrhage. Left conjunctiva has a hemorrhage. Right eye exhibits normal extraocular motion and no nystagmus. Left eye exhibits abnormal extraocular motion.  Genitourinary:  Genitourinary  Comments: Declined genital examination today.  Skin: Skin is warm and dry. No rash noted. He is not diaphoretic. No erythema. No pallor.  Psychiatric: He has a normal mood and affect. His behavior is normal. Judgment and thought content normal.  Nursing note and vitals reviewed.         Assessment & Plan:   1. HSV-2 (herpes simplex virus 2) infection     HSV-2 (herpes simplex virus 2) infection Discussed at length lab results. Discussed transmission risks. Discussed antiviral options, he declined any rx treatment. HSV2 Information hand out provided. After verbal consent given,  wife present for STI screening results. F/u as needed.     FOLLOW-UP:  Return in about 3 months (around 12/13/2016) for CPE, Fasting Lab Draw.

## 2016-09-16 DIAGNOSIS — S0552XD Penetrating wound with foreign body of left eyeball, subsequent encounter: Secondary | ICD-10-CM | POA: Diagnosis not present

## 2016-09-16 DIAGNOSIS — Z48810 Encounter for surgical aftercare following surgery on the sense organs: Secondary | ICD-10-CM | POA: Diagnosis not present

## 2016-09-16 DIAGNOSIS — S0532XD Ocular laceration without prolapse or loss of intraocular tissue, left eye, subsequent encounter: Secondary | ICD-10-CM | POA: Diagnosis not present

## 2016-10-03 ENCOUNTER — Encounter: Payer: Self-pay | Admitting: Adult Health

## 2016-10-03 ENCOUNTER — Ambulatory Visit (INDEPENDENT_AMBULATORY_CARE_PROVIDER_SITE_OTHER): Payer: 59 | Admitting: Adult Health

## 2016-10-03 DIAGNOSIS — F17200 Nicotine dependence, unspecified, uncomplicated: Secondary | ICD-10-CM | POA: Diagnosis not present

## 2016-10-03 MED ORDER — BUPROPION HCL ER (SR) 150 MG PO TB12: ORAL_TABLET | ORAL | 0 refills | Status: DC

## 2016-10-03 NOTE — Progress Notes (Signed)
Subjective:    Patient ID: EAN GETTEL, male    DOB: 04/15/1980, 36 y.o.   MRN: [id number]  HPI:  Robert Quinn is here for smoking cessation.  He has been smoking since age 50, 1.5 to 2 packs/day.  He is ready to quit!- GREAT! He discussed medication benefits/risks with psychiatrist and Wellbutrin and Chantix were both acceptable treatment options per pt. He denies acute depression or addiction issues. He denies CP/dyspnea at rest/palpitations.  Wife at [facility] during OV Of Note: He remains out of work r/t [facility] injury involving OS-next f/u with eye surgeon 10/31/16.  Patient Care Team    Relationship Specialty Notifications Start End  Julaine Fusi, NP PCP - General Family Medicine  08/16/16     Patient Active Problem List   Diagnosis Date Noted  . HSV-2 (herpes simplex virus 2) infection 09/13/2016  . Exposure to sexually transmitted disease (STD) 08/24/2016  . Healthcare maintenance 08/24/2016  . Tobacco use disorder 08/24/2016  . Bipolar affective disorder (HCC) 08/24/2016     Past Medical History:  Diagnosis Date  . Chronic pain   . Hypoglycemia      Past Surgical History:  Procedure Laterality Date  . HERNIA REPAIR    . KNEE SURGERY    . RETINAL DETACHMENT SURGERY       Family History  Problem Relation Age of Onset  . Heart disease Father   . Heart attack Father   . Stroke Father   . Heart Problems Sister   . Healthy Brother   . Healthy Brother   . Healthy Brother      History  Drug Use No     History  Alcohol Use No     History  Smoking Status  . Current Every Day Smoker  . Packs/day: 1.00  . Years: 17.00  . Types: Cigarettes  Smokeless Tobacco  . Never Used     Outpatient Encounter Prescriptions as of 10/03/2016  Medication Sig  . OLANZapine (ZYPREXA) 20 MG tablet Take 20 mg by mouth daily.  . prednisoLONE acetate (PRED FORTE) 1 % ophthalmic suspension Place 1 drop into the left eye 4 (four) times daily.  Marland Kitchen buPROPion (WELLBUTRIN SR) 150  MG 12 hr tablet 1 tab daily for three days, then 2 tabs twice daily. Start 1-2 weeks prior to quit date.   No facility-administered encounter medications on file as of 10/03/2016.     Allergies: Patient has no known allergies.  Body mass index is 28.71 kg/m.  Blood pressure (!) 101/57, pulse 93, height 5' 8.5" (1.74 m), weight 191 lb 9.6 oz (86.9 kg).    Review of Systems  Constitutional: Positive for fatigue. Negative for activity change, appetite change, chills, diaphoresis, fever and unexpected weight change.  HENT: Positive for congestion and postnasal drip.   Eyes: Positive for visual disturbance.  Respiratory: Positive for cough. Negative for chest tightness, shortness of breath, wheezing and stridor.   Cardiovascular: Negative for chest pain, palpitations and leg swelling.  Gastrointestinal: Negative for abdominal distention, abdominal pain, blood in stool, constipation, diarrhea, nausea and vomiting.  Endocrine: Negative for cold intolerance, heat intolerance, polydipsia, polyphagia and polyuria.  Genitourinary: Negative for difficulty urinating.  Neurological: Negative for dizziness and headaches.       Objective:   Physical Exam  Constitutional: He appears well-developed and well-nourished. No distress.  Cardiovascular: Normal rate, regular rhythm and normal heart sounds.   No murmur heard. Pulmonary/Chest: Effort normal and breath sounds normal. No respiratory distress.  He has no wheezes. He has no rales. He exhibits no tenderness.  Skin: Skin is warm and dry. No rash noted. He is not diaphoretic. No erythema. No pallor.  Psychiatric: He has a normal mood and affect. His behavior is normal. Judgment and thought content normal.          Assessment & Plan:   1. Tobacco use disorder     Tobacco use disorder CDC smoking cessation information provided. He discussed medication benefits/risks with psychiatrist and Wellbutrin and Chantix were both acceptable  treatment options per pt. He denies acute depression. Started on Wellbutrin- f/u in 4 weeks. YOU CAN DO IT!!!    FOLLOW-UP:  Return in about 4 weeks (around 10/31/2016) for Evaluate Medication Effectiveness.

## 2016-10-03 NOTE — Assessment & Plan Note (Signed)
CDC smoking cessation information provided. He discussed medication benefits/risks with psychiatrist and Wellbutrin and Chantix were both acceptable treatment options per pt. He denies acute depression. Started on Wellbutrin- f/u in 4 weeks. YOU CAN DO IT!!!

## 2016-10-03 NOTE — Patient Instructions (Signed)
Bupropion sustained-release tablets (smoking cessation) What is this medicine? BUPROPION (byoo PROE pee on) is used to help people quit smoking. This medicine may be used for other purposes; ask your health care provider or pharmacist if you have questions. COMMON BRAND NAME(S): Buproban, Zyban What should I tell my health care provider before I take this medicine? They need to know if you have any of these conditions: -an eating disorder, such as anorexia or bulimia -bipolar disorder or psychosis -diabetes or high blood sugar, treated with medication -glaucoma -head injury or brain tumor -heart disease, previous heart attack, or irregular heart beat -high blood pressure -kidney or liver disease -seizures -suicidal thoughts or a previous suicide attempt -Tourette's syndrome -weight loss -an unusual or allergic reaction to bupropion, other medicines, foods, dyes, or preservatives -breast-feeding -pregnant or trying to become pregnant How should I use this medicine? Take this medicine by mouth with a glass of water. Follow the directions on the prescription label. You can take it with or without food. If it upsets your stomach, take it with food. Do not cut, crush or chew this medicine. Take your medicine at regular intervals. If you take this medicine more than once a day, take your second dose at least 8 hours after you take your first dose. To limit difficulty in sleeping, avoid taking this medicine at bedtime. Do not take your medicine more often than directed. Do not stop taking this medicine suddenly except upon the advice of your doctor. Stopping this medicine too quickly may cause serious side effects. A special MedGuide will be given to you by the pharmacist with each prescription and refill. Be sure to read this information carefully each time. Talk to your pediatrician regarding the use of this medicine in children. Special care may be needed. Overdosage: If you think you have  taken too much of this medicine contact a poison control center or emergency room at once. NOTE: This medicine is only for you. Do not share this medicine with others. What if I miss a dose? If you miss a dose, skip the missed dose and take your next tablet at the regular time. There should be at least 8 hours between doses. Do not take double or extra doses. What may interact with this medicine? Do not take this medicine with any of the following medications: -linezolid -MAOIs like Azilect, Carbex, Eldepryl, Marplan, Nardil, and Parnate -methylene blue (injected into a vein) -other medicines that contain bupropion like Wellbutrin This medicine may also interact with the following medications: -alcohol -certain medicines for anxiety or sleep -certain medicines for blood pressure like metoprolol, propranolol -certain medicines for depression or psychotic disturbances -certain medicines for HIV or AIDS like efavirenz, lopinavir, nelfinavir, ritonavir -certain medicines for irregular heart beat like propafenone, flecainide -certain medicines for Parkinson's disease like amantadine, levodopa -certain medicines for seizures like carbamazepine, phenytoin, phenobarbital -cimetidine -clopidogrel -cyclophosphamide -digoxin -furazolidone -isoniazid -nicotine -orphenadrine -procarbazine -steroid medicines like prednisone or cortisone -stimulant medicines for attention disorders, weight loss, or to stay awake -tamoxifen -theophylline -thiotepa -ticlopidine -tramadol -warfarin This list may not describe all possible interactions. Give your health care provider a list of all the medicines, herbs, non-prescription drugs, or dietary supplements you use. Also tell them if you smoke, drink alcohol, or use illegal drugs. Some items may interact with your medicine. What should I watch for while using this medicine? Visit your doctor or health care professional for regular checks on your progress.  This medicine should be used together with a   patient support program. It is important to participate in a behavioral program, counseling, or other support program that is recommended by your health care professional. Patients and their families should watch out for new or worsening thoughts of suicide or depression. Also watch out for sudden changes in feelings such as feeling anxious, agitated, panicky, irritable, hostile, aggressive, impulsive, severely restless, overly excited and hyperactive, or not being able to sleep. If this happens, especially at the beginning of treatment or after a change in dose, call your health care professional. Avoid alcoholic drinks while taking this medicine. Drinking excessive alcoholic beverages, using sleeping or anxiety medicines, or quickly stopping the use of these agents while taking this medicine may increase your risk for a seizure. Do not drive or use heavy machinery until you know how this medicine affects you. This medicine can impair your ability to perform these tasks. Do not take this medicine close to bedtime. It may prevent you from sleeping. Your mouth may get dry. Chewing sugarless gum or sucking hard candy, and drinking plenty of water may help. Contact your doctor if the problem does not go away or is severe. Do not use nicotine patches or chewing gum without the advice of your doctor or health care professional while taking this medicine. You may need to have your blood pressure taken regularly if your doctor recommends that you use both nicotine and this medicine together. What side effects may I notice from receiving this medicine? Side effects that you should report to your doctor or health care professional as soon as possible: -allergic reactions like skin rash, itching or hives, swelling of the face, lips, or tongue -breathing problems -changes in vision -confusion -elevated mood, decreased need for sleep, racing thoughts, impulsive  behavior -fast or irregular heartbeat -hallucinations, loss of contact with reality -increased blood pressure -redness, blistering, peeling or loosening of the skin, including inside the mouth -seizures -suicidal thoughts or other mood changes -unusually weak or tired -vomiting Side effects that usually do not require medical attention (report to your doctor or health care professional if they continue or are bothersome): -constipation -headache -loss of appetite -nausea -tremors -weight loss This list may not describe all possible side effects. Call your doctor for medical advice about side effects. You may report side effects to FDA at 1-800-FDA-1088. Where should I keep my medicine? Keep out of the reach of children. Store at room temperature between 20 and 25 degrees C (68 and 77 degrees F). Protect from light. Keep container tightly closed. Throw away any unused medicine after the expiration date. NOTE: This sheet is a summary. It may not cover all possible information. If you have questions about this medicine, talk to your doctor, pharmacist, or health care provider.  2018 Elsevier/Gold Standard (2015-06-19 13:49:28)  Wellbutrin- 1 tablet daily for first three days, then increase to 1 tablet twice daily and that will be your maintenance dose. Start medication 1-2 weeks prior to quit date- YOU CAN DO IT! If you have any thoughts of harming yourself/others stop medication and seek immediate medical assistance. Follow-up in 4 weeks, sooner if needed. YOU CAN DO IT!

## 2016-11-01 NOTE — Progress Notes (Deleted)
Subjective:    Patient ID: Robert Quinn, male    DOB: 09/25/1980, 36 y.o.   MRN: [id number]  HPI:10/03/2016 OV Note:  Mr. Cerami is here for smoking cessation.  He has been smoking since age 4, 1.5 to 2 packs/day.  He is ready to quit!- GREAT! He discussed medication benefits/risks with psychiatrist and Wellbutrin and Chantix were both acceptable treatment options per pt. He denies acute depression or addiction issues. He denies CP/dyspnea at rest/palpitations.  Wife at [facility] during OV Of Note: He remains out of work r/t [facility] injury involving OS-next f/u with eye surgeon 10/31/16.  Today's OV Note Mr. Kuechle is here for f/u on Wellbutrin   Patient Care Team    Relationship Specialty Notifications Start End  Julaine Fusi, NP PCP - General Family Medicine  08/16/16     Patient Active Problem List   Diagnosis Date Noted  . HSV-2 (herpes simplex virus 2) infection 09/13/2016  . Exposure to sexually transmitted disease (STD) 08/24/2016  . Healthcare maintenance 08/24/2016  . Tobacco use disorder 08/24/2016  . Bipolar affective disorder (HCC) 08/24/2016     Past Medical History:  Diagnosis Date  . Chronic pain   . Hypoglycemia      Past Surgical History:  Procedure Laterality Date  . HERNIA REPAIR    . KNEE SURGERY    . RETINAL DETACHMENT SURGERY       Family History  Problem Relation Age of Onset  . Heart disease Father   . Heart attack Father   . Stroke Father   . Heart Problems Sister   . Healthy Brother   . Healthy Brother   . Healthy Brother      History  Drug Use No     History  Alcohol Use No     History  Smoking Status  . Current Every Day Smoker  . Packs/day: 1.00  . Years: 17.00  . Types: Cigarettes  Smokeless Tobacco  . Never Used     Outpatient Encounter Prescriptions as of 11/03/2016  Medication Sig  . buPROPion (WELLBUTRIN SR) 150 MG 12 hr tablet 1 tab daily for three days, then 2 tabs twice daily. Start 1-2 weeks prior to quit  date.  Marland Kitchen OLANZapine (ZYPREXA) 20 MG tablet Take 20 mg by mouth daily.  . prednisoLONE acetate (PRED FORTE) 1 % ophthalmic suspension Place 1 drop into the left eye 4 (four) times daily.   No facility-administered encounter medications on file as of 11/03/2016.     Allergies: Patient has no known allergies.  There is no height or weight on file to calculate BMI.  There were no vitals taken for this visit.    Review of Systems  Constitutional: Positive for fatigue. Negative for activity change, appetite change, chills, diaphoresis, fever and unexpected weight change.  HENT: Positive for congestion and postnasal drip.   Eyes: Positive for visual disturbance.  Respiratory: Positive for cough. Negative for chest tightness, shortness of breath, wheezing and stridor.   Cardiovascular: Negative for chest pain, palpitations and leg swelling.  Gastrointestinal: Negative for abdominal distention, abdominal pain, blood in stool, constipation, diarrhea, nausea and vomiting.  Endocrine: Negative for cold intolerance, heat intolerance, polydipsia, polyphagia and polyuria.  Genitourinary: Negative for difficulty urinating.  Neurological: Negative for dizziness and headaches.       Objective:   Physical Exam  Constitutional: He appears well-developed and well-nourished. No distress.  Cardiovascular: Normal rate, regular rhythm and normal heart sounds.   No murmur heard.  Pulmonary/Chest: Effort normal and breath sounds normal. No respiratory distress. He has no wheezes. He has no rales. He exhibits no tenderness.  Skin: Skin is warm and dry. No rash noted. He is not diaphoretic. No erythema. No pallor.  Psychiatric: He has a normal mood and affect. His behavior is normal. Judgment and thought content normal.          Assessment & Plan:   No diagnosis found.  No problem-specific Assessment & Plan notes found for this encounter.    FOLLOW-UP:  No Follow-up on file.

## 2016-11-03 ENCOUNTER — Ambulatory Visit: Payer: Self-pay | Admitting: Adult Health

## 2016-11-23 ENCOUNTER — Encounter: Payer: 59 | Admitting: Adult Health

## 2016-12-11 NOTE — Progress Notes (Deleted)
Subjective:    Patient ID: Robert Quinn, male    DOB: Apr 17, 1980, 36 y.o.   MRN: [id number]  HPI: 10/03/16 OV:  Robert Quinn is here for smoking cessation.  He has been smoking since age 36, 1.5 to 2 packs/day.  He is ready to quit!- GREAT! He discussed medication benefits/risks with psychiatrist and Wellbutrin and Chantix were both acceptable treatment options per pt. He denies acute depression or addiction issues. He denies CP/dyspnea at rest/palpitations.  [facility] Of Note: He [facility] of [facility]-next f/u with eye surgeon 10/31/16.  12/13/16 OV: Robert Quinn is here for CPE.  He did not f/u for smoking cessation at end of Oct 2018, he Healthcare Maintenance: Colonoscopy-   Patient Care Team    Relationship Specialty Notifications Start End  Julaine Fusianford, Montanna Mcbain D, NP PCP - General Family Medicine  08/16/16     Patient Active Problem List   Diagnosis Date Noted  . HSV-2 (herpes simplex virus 2) infection 09/13/2016  . Exposure to sexually transmitted disease (STD) 08/24/2016  . Healthcare maintenance 08/24/2016  . Tobacco use disorder 08/24/2016  . Bipolar affective disorder (HCC) 08/24/2016     Past Medical History:  Diagnosis Date  . Chronic pain   . Hypoglycemia      Past Surgical History:  Procedure Laterality Date  . HERNIA REPAIR    . KNEE SURGERY    . RETINAL DETACHMENT SURGERY       Family History  Problem Relation Age of Onset  . Heart disease Father   . Heart attack Father   . Stroke Father   . Heart Problems Sister   . Healthy Brother   . Healthy Brother   . Healthy Brother      Social History   Substance and Sexual Activity  Drug Use No     Social History   Substance and Sexual Activity  Alcohol Use No     Social History   Tobacco Use  Smoking Status Current Every Day Smoker  . Packs/day: 1.00  . Years: 17.00  . Pack years: 17.00  . Types: Cigarettes  Smokeless Tobacco Never Used      Outpatient Encounter Medications as of 12/13/2016  Medication Sig  . buPROPion (WELLBUTRIN SR) 150 MG 12 hr tablet 1 tab daily for three days, then 2 tabs twice daily. Start 1-2 weeks prior to quit date.  Marland Kitchen. OLANZapine (ZYPREXA) 20 MG tablet Take 20 mg by mouth daily.  . prednisoLONE acetate (PRED FORTE) 1 % ophthalmic suspension Place 1 drop into the left eye 4 (four) times daily.   No facility-administered encounter medications on file as of 12/13/2016.     Allergies: Patient has no known allergies.  There is no height or weight on file to calculate BMI.  There were no vitals taken for this visit.    Review of Systems  Constitutional: Positive for fatigue. Negative for activity change, appetite change, chills, diaphoresis, fever and unexpected weight change.  HENT: Positive for congestion and postnasal drip.   Eyes: Positive for visual disturbance.  Respiratory: Positive for cough. Negative for chest tightness, shortness of breath, wheezing and stridor.   Cardiovascular: Negative for chest pain, palpitations and leg swelling.  Gastrointestinal: Negative for abdominal distention, abdominal pain, blood in stool, constipation, diarrhea, nausea and vomiting.  Endocrine: Negative for cold intolerance, heat intolerance, polydipsia, polyphagia and polyuria.  Genitourinary: Negative for difficulty urinating.  Neurological: Negative for dizziness and headaches.  Objective:   Physical Exam  Constitutional: He appears well-developed and well-nourished. No distress.  Cardiovascular: Normal rate, regular rhythm and normal heart sounds.  No murmur heard. Pulmonary/Chest: Effort normal and breath sounds normal. No respiratory distress. He has no wheezes. He has no rales. He exhibits no tenderness.  Skin: Skin is warm and dry. No rash noted. He is not diaphoretic. No erythema. No pallor.  Psychiatric: He has a normal mood and affect. His behavior is normal. Judgment and thought  content normal.          Assessment & Plan:   No diagnosis found.  No problem-specific Assessment & Plan notes found for this encounter.    FOLLOW-UP:  No Follow-up on file.

## 2016-12-13 ENCOUNTER — Encounter: Payer: Self-pay | Admitting: Adult Health

## 2017-03-19 IMAGING — CR DG LUMBAR SPINE COMPLETE 4+V
5 series · 5 of 5 positions shown · non-contrast
Comparison: CT 06/08/2010

CLINICAL DATA: Fall, severe pain. Pain with movement. Upper lower
back pain.

EXAM:
LUMBAR SPINE - COMPLETE 4+ VIEW

[l-spine ap]
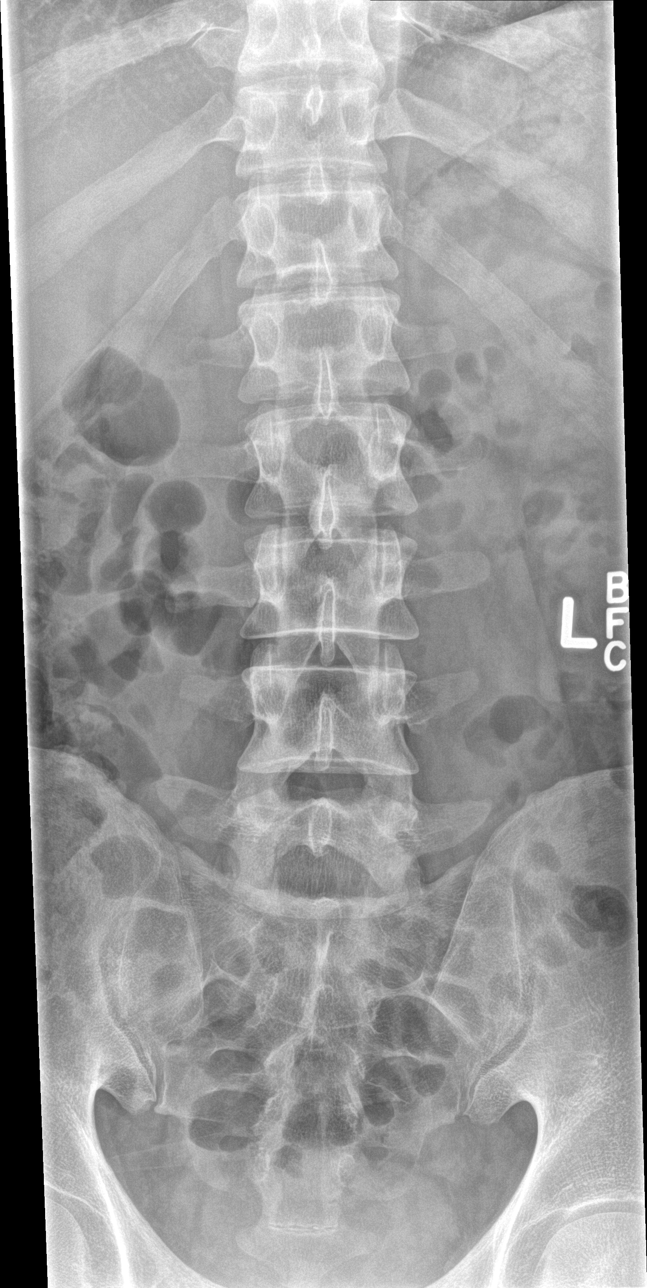

[l-spine obl (1 of 2)]
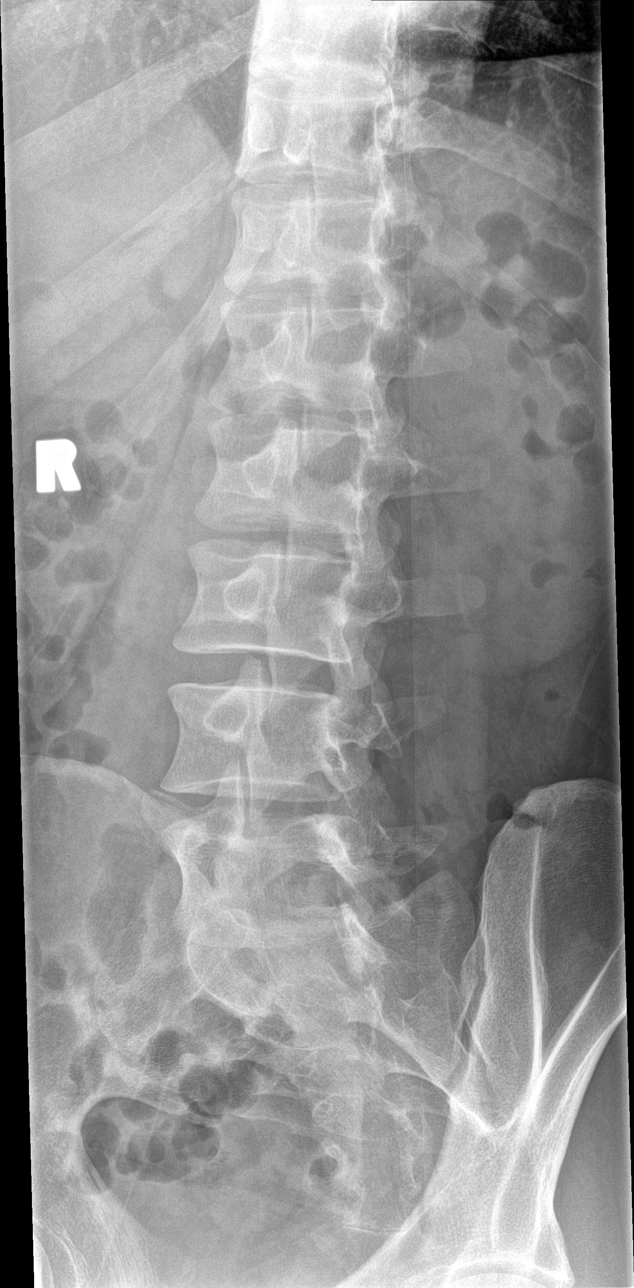

[l-spine obl (2 of 2)]
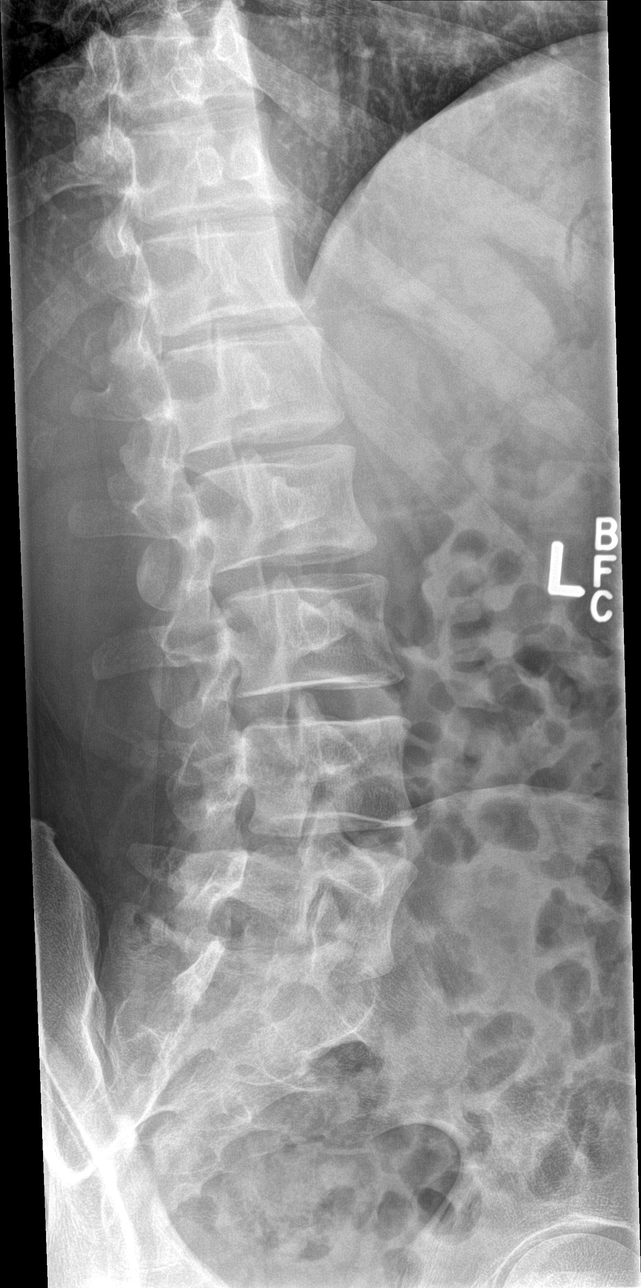

[l-spine lat]
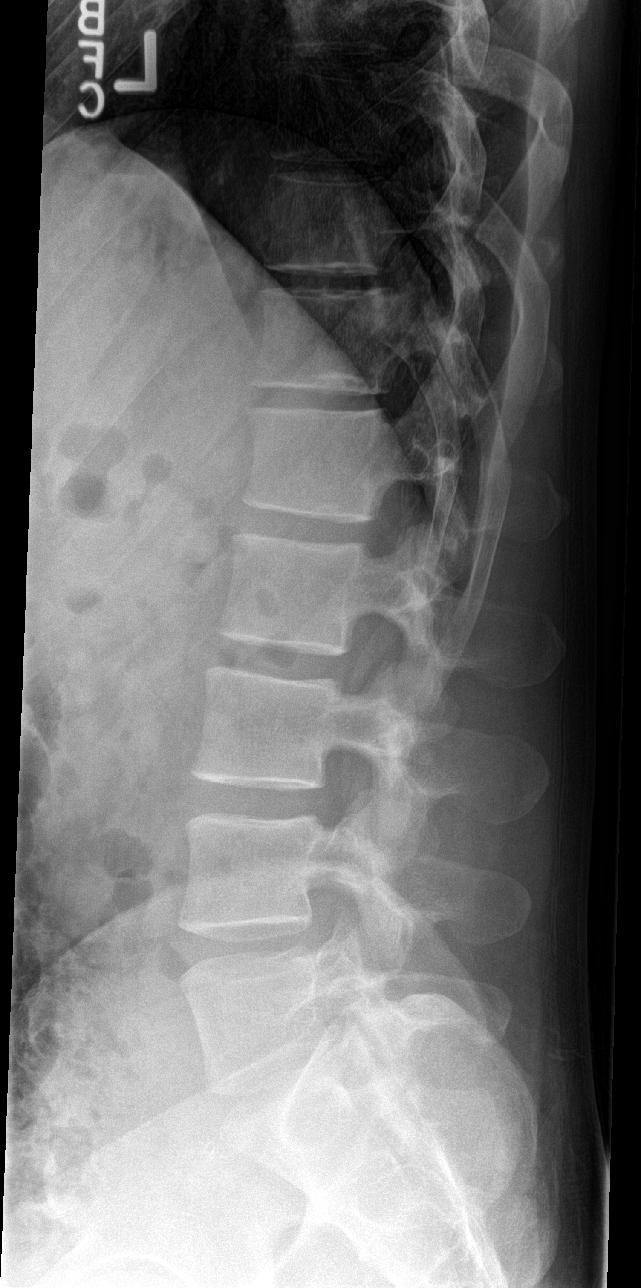

[l-spine spot]
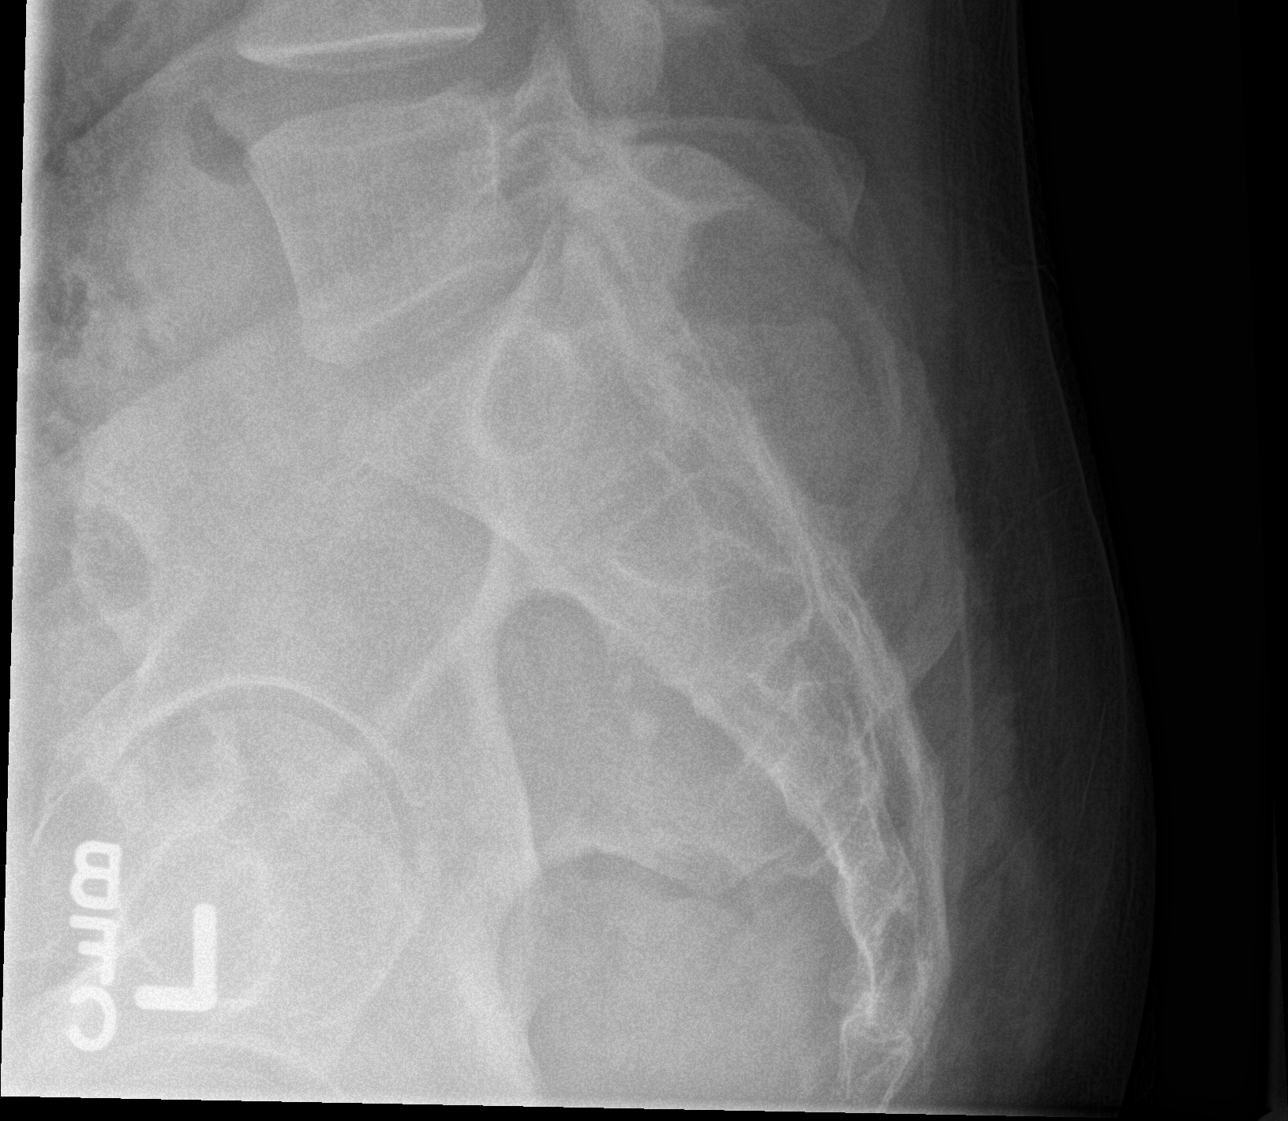

[5 of 5 positions shown; findings below may reference images not displayed]

FINDINGS: Normal alignment of lumbar vertebral bodies. No loss of vertebral
body height or disc height. No pars fracture. No subluxation.
IMPRESSION: Normal lumbar spine radiographs.

## 2017-04-10 DIAGNOSIS — H2702 Aphakia, left eye: Secondary | ICD-10-CM | POA: Insufficient documentation

## 2017-04-10 DIAGNOSIS — H35372 Puckering of macula, left eye: Secondary | ICD-10-CM | POA: Insufficient documentation

## 2017-04-19 DIAGNOSIS — S0532XD Ocular laceration without prolapse or loss of intraocular tissue, left eye, subsequent encounter: Secondary | ICD-10-CM | POA: Diagnosis not present

## 2017-04-19 DIAGNOSIS — H2702 Aphakia, left eye: Secondary | ICD-10-CM | POA: Diagnosis not present

## 2017-04-19 DIAGNOSIS — H35372 Puckering of macula, left eye: Secondary | ICD-10-CM | POA: Diagnosis not present

## 2017-09-06 NOTE — Progress Notes (Signed)
Subjective:    Patient ID: Robert HeftWilliam H Quinn, male    DOB: 16-Feb-1980, 37 y.o.   MRN: [id number]  HPI:  Mr. Robert Quinn is here for f/u: Tobacco Use and HLD He reports having his lipids [facility] and states "they told me that my cholesterol was really high". He also reports having eaten a meal high in saturated fat within an hour of the lab draw. He eats a diet high in CHO/sugar/saturated fat. He estimates to drink >8 soda pops/day He was on Wellbutrin for smoking cessation, however states "It didn't help me with the cigarettes". He has been off Wellbutrin >3 months He reports [facility] about using Chantix while he is on Olanzapine (Zyprexa) 20mg  QD, per pt "he says it is okay to use together" He denies thoughts of harming himself/others He reports a stable mood He is currently smoking pack and half/day, he has been using tobacco >17 years He denies ETOH or illicit drug use Lipid panel was ordered 08/2016- was never completed  Patient Care Team    Relationship Specialty Notifications Start End  Julaine Fusianford, Broderic Bara D, NP PCP - General Family Medicine  08/16/16     Patient Active Problem List   Diagnosis Date Noted  . HSV-2 (herpes simplex virus 2) infection 09/13/2016  . Exposure to sexually transmitted disease (STD) 08/24/2016  . Healthcare maintenance 08/24/2016  . Tobacco use disorder 08/24/2016  . Bipolar affective disorder (HCC) 08/24/2016     Past Medical History:  Diagnosis Date  . Chronic pain   . Hypoglycemia      Past Surgical History:  Procedure Laterality Date  . HERNIA REPAIR    . INTRAOCULAR LENS INSERTION    . KNEE SURGERY    . RETINAL DETACHMENT SURGERY       Family History  Problem Relation Age of Onset  . Heart disease Father   . Heart attack Father   . Stroke Father   . Heart Problems Sister   . Healthy Brother   . Healthy Brother   . Healthy Brother      Social History   Substance and Sexual  Activity  Drug Use No     Social History   Substance and Sexual Activity  Alcohol Use No     Social History   Tobacco Use  Smoking Status Current Every Day Smoker  . Packs/day: 1.00  . Years: 17.00  . Pack years: 17.00  . Types: Cigarettes  Smokeless Tobacco Never Used     Outpatient Encounter Medications as of 09/07/2017  Medication Sig  . OLANZapine (ZYPREXA) 20 MG tablet Take 20 mg by mouth daily.  . prednisoLONE acetate (PRED FORTE) 1 % ophthalmic suspension Place 1 drop into the left eye 4 (four) times daily.  . [DISCONTINUED] buPROPion (WELLBUTRIN SR) 150 MG 12 hr tablet 1 tab daily for three days, then 2 tabs twice daily. Start 1-2 weeks prior to quit date.  . varenicline (CHANTIX PAK) 0.5 MG X 11 & 1 MG X 42 tablet Take one 0.5 mg tablet by mouth once daily for 3 days, then increase to one 0.5 mg tablet twice daily for 4 days, then increase to one 1 mg tablet twice daily.   No facility-administered encounter medications on file as of 09/07/2017.     Allergies: Patient has no known allergies.  Body mass index is 31.68 kg/m.  Blood pressure 115/76, pulse 67, height 5' 8.5" (1.74 m), weight 211 lb 6.4 oz (  95.9 kg), SpO2 98 %.  Review of Systems  Constitutional: Positive for fatigue. Negative for activity change, appetite change, chills, diaphoresis, fever and unexpected weight change.  HENT: Negative for congestion.   Eyes: Positive for visual disturbance.  Respiratory: Negative for cough, chest tightness, shortness of breath, wheezing and stridor.   Cardiovascular: Negative for chest pain, palpitations and leg swelling.  Gastrointestinal: Negative for abdominal distention, abdominal pain, blood in stool, constipation, diarrhea, nausea and vomiting.  Genitourinary: Negative for difficulty urinating and frequency.  Musculoskeletal: Negative for arthralgias.  Skin: Negative for color change, pallor, rash and wound.  Neurological: Negative for dizziness and  headaches.  Hematological: Does not bruise/bleed easily.  Psychiatric/Behavioral: Negative for behavioral problems, confusion, decreased concentration, dysphoric mood, hallucinations, self-injury, sleep disturbance and suicidal ideas. The patient is not nervous/anxious and is not hyperactive.        Objective:   Physical Exam  Constitutional: He is oriented to person, place, and time. He appears well-developed and well-nourished. No distress.  HENT:  Head: Normocephalic and atraumatic.  Right Ear: External ear normal. No decreased hearing is noted.  Left Ear: External ear normal. No decreased hearing is noted.  Nose: Nose normal.  Mouth/Throat: Oropharynx is clear and moist. Abnormal dentition.  Eyes: Pupils are equal, round, and reactive to light. EOM are normal. Left conjunctiva is injected.  Cardiovascular: Normal rate, regular rhythm, normal heart sounds and intact distal pulses.  No murmur heard. Pulmonary/Chest: Effort normal and breath sounds normal. No stridor. No respiratory distress. He has no wheezes. He has no rales. He exhibits no tenderness.  Neurological: He is alert and oriented to person, place, and time.  Skin: Skin is warm and dry. Capillary refill takes less than 2 seconds. No rash noted. He is not diaphoretic. No erythema. No pallor.  Psychiatric: He has a normal mood and affect. His behavior is normal. Judgment and thought content normal.      Assessment & Plan:   1. Healthcare maintenance   2. Bipolar affective disorder, remission status unspecified (HCC)     Healthcare maintenance Continue all medications as directed. Follow Chantix taper per pack instructions. Increase water intake, strive for at least 100 ounces/day.   Follow Mediterranean diet. Increase regular exercise.  Recommend at least 30 minutes daily, 5 days per week of walking, jogging, biking, swimming, YouTube/Pinterest workout videos. Recommend fasting lab appt in a few weeks and complete  physical in 2-3 months.  Bipolar affective disorder (HCC) Followed by [facility] every 3 months Reports stable mood, currently on Olanzapine 20mg  QD  Pt was in the office today for 25+ minutes, I spent >75% of time in face to face counseling of patient's various medical conditions and in coordination of care  FOLLOW-UP:  Return in about 3 months (around 12/08/2017) for Fasting Labs, CPE.

## 2017-09-07 ENCOUNTER — Encounter: Payer: Self-pay | Admitting: Adult Health

## 2017-09-07 ENCOUNTER — Ambulatory Visit: Payer: 59 | Admitting: Adult Health

## 2017-09-07 VITALS — BP 115/76 | HR 67 | Ht 68.5 in | Wt 211.4 lb

## 2017-09-07 DIAGNOSIS — F319 Bipolar disorder, unspecified: Secondary | ICD-10-CM

## 2017-09-07 DIAGNOSIS — Z0000 Encounter for general adult medical examination without abnormal findings: Secondary | ICD-10-CM

## 2017-09-07 MED ORDER — VARENICLINE TARTRATE 0.5 MG X 11 & 1 MG X 42 PO MISC: ORAL | 0 refills | Status: DC

## 2017-09-07 NOTE — Assessment & Plan Note (Signed)
[facility] Reports stable mood, currently on Olanzapine 20mg  QD

## 2017-09-07 NOTE — Patient Instructions (Signed)
Mediterranean Diet A Mediterranean diet refers to food and lifestyle choices that are based on the traditions of countries located on the [place]. This way of eating has been shown to help prevent certain conditions and improve outcomes for people who have chronic diseases, like kidney disease and heart disease. What are tips for following this plan? Lifestyle  Cook and eat meals together with your family, when possible.  Drink enough fluid to keep your urine clear or pale yellow.  Be physically active every day. This includes: ? Aerobic exercise like running or swimming. ? Leisure activities like gardening, walking, or housework.  Get 7-8 hours of sleep each night.  If recommended by your health care provider, drink red wine in moderation. This means 1 glass a day for nonpregnant women and 2 glasses a day for men. A glass of wine equals 5 oz (150 mL). Reading food labels  Check the serving size of packaged foods. For foods such as rice and pasta, the serving size refers to the amount of cooked product, not dry.  Check the total fat in packaged foods. Avoid foods that have saturated fat or trans fats.  Check the ingredients list for added sugars, such as corn syrup. Shopping  At the grocery store, buy most of your food from the areas near the walls of the store. This includes: ? Fresh fruits and vegetables (produce). ? Grains, beans, nuts, and seeds. Some of these may be available in unpackaged forms or large amounts (in bulk). ? Fresh seafood. ? Poultry and eggs. ? Low-fat dairy products.  Buy whole ingredients instead of prepackaged foods.  Buy fresh fruits and vegetables in-season from local farmers markets.  Buy frozen fruits and vegetables in resealable bags.  If you do not have access to quality fresh seafood, buy precooked frozen shrimp or canned fish, such as tuna, salmon, or sardines.  Buy small amounts of raw or cooked vegetables, salads, or olives from the  deli or salad bar at your store.  Stock your pantry so you always have certain foods on hand, such as olive oil, canned tuna, canned tomatoes, rice, pasta, and beans. Cooking  Cook foods with extra-virgin olive oil instead of using butter or other vegetable oils.  Have meat as a side dish, and have vegetables or grains as your main dish. This means having meat in small portions or adding small amounts of meat to foods like pasta or stew.  Use beans or vegetables instead of meat in common dishes like chili or lasagna.  Experiment with different cooking methods. Try roasting or broiling vegetables instead of steaming or sauteing them.  Add frozen vegetables to soups, stews, pasta, or rice.  Add nuts or seeds for added healthy fat at each meal. You can add these to yogurt, salads, or vegetable dishes.  Marinate fish or vegetables using olive oil, lemon juice, garlic, and fresh herbs. Meal planning  Plan to eat 1 vegetarian meal one day each week. Try to work up to 2 vegetarian meals, if possible.  Eat seafood 2 or more times a week.  Have healthy snacks readily available, such as: ? Vegetable sticks with hummus. ? Greek yogurt. ? Fruit and nut trail mix.  Eat balanced meals throughout the week. This includes: ? Fruit: 2-3 servings a day ? Vegetables: 4-5 servings a day ? Low-fat dairy: 2 servings a day ? Fish, poultry, or lean meat: 1 serving a day ? Beans and legumes: 2 or more servings a week ? Nuts   and seeds: 1-2 servings a day ? Whole grains: 6-8 servings a day ? Extra-virgin olive oil: 3-4 servings a day  Limit red meat and sweets to only a few servings a month What are my food choices?  Mediterranean diet ? Recommended ? Grains: Whole-grain pasta. Brown rice. Bulgar wheat. Polenta. Couscous. Whole-wheat bread. Orpah Cobb. ? Vegetables: Artichokes. Beets. Broccoli. Cabbage. Carrots. Eggplant. Green beans. Chard. Kale. Spinach. Onions. Leeks. Peas. Squash.  Tomatoes. Peppers. Radishes. ? Fruits: Apples. Apricots. Avocado. Berries. Bananas. Cherries. Dates. Figs. Grapes. Lemons. Melon. Oranges. Peaches. Plums. Pomegranate. ? Meats and other protein foods: Beans. Almonds. Sunflower seeds. Pine nuts. Peanuts. Cod. Salmon. Scallops. Shrimp. Tuna. Tilapia. Clams. Oysters. Eggs. ? Dairy: Low-fat milk. Cheese. Greek yogurt. ? Beverages: Water. Red wine. Herbal tea. ? Fats and oils: Extra virgin olive oil. Avocado oil. Grape seed oil. ? Sweets and desserts: [place] yogurt with honey. Baked apples. Poached pears. Trail mix. ? Seasoning and other foods: Basil. Cilantro. Coriander. Cumin. Mint. Parsley. Sage. Rosemary. Tarragon. Garlic. Oregano. Thyme. Pepper. Balsalmic vinegar. Tahini. Hummus. Tomato sauce. Olives. Mushrooms. ? Limit these ? Grains: Prepackaged pasta or rice dishes. Prepackaged cereal with added sugar. ? Vegetables: Deep fried potatoes (french fries). ? Fruits: Fruit canned in syrup. ? Meats and other protein foods: Beef. Pork. Lamb. Poultry with skin. Hot dogs. Tomasa Blase. ? Dairy: Ice cream. Sour cream. Whole milk. ? Beverages: Juice. Sugar-sweetened soft drinks. Beer. Liquor and spirits. ? Fats and oils: Butter. Canola oil. Vegetable oil. Beef fat (tallow). Lard. ? Sweets and desserts: Cookies. Cakes. Pies. Candy. ? Seasoning and other foods: Mayonnaise. Premade sauces and marinades. ? The items listed may not be a complete list. Talk with your dietitian about what dietary choices are right for you. Summary  The Mediterranean diet includes both food and lifestyle choices.  Eat a variety of fresh fruits and vegetables, beans, nuts, seeds, and whole grains.  Limit the amount of red meat and sweets that you eat.  Talk with your health care provider about whether it is safe for you to drink red wine in moderation. This means 1 glass a day for nonpregnant women and 2 glasses a day for men. A glass of wine equals 5 oz (150 mL). This information  is not intended to replace advice given to you by your health care provider. Make sure you discuss any questions you have with your health care provider. Document Released: 08/20/2015 Document Revised: 09/22/2015 Document Reviewed: 08/20/2015 Elsevier Interactive Patient Education  2018 [employer].  Varenicline oral tablets What is this medicine? VARENICLINE (var EN i kleen) is used to help people quit smoking. It can reduce the symptoms caused by stopping smoking. It is used with a patient support program recommended by your physician. This medicine may be used for other purposes; ask your health care provider or pharmacist if you have questions. COMMON BRAND NAME(S): Chantix What should I tell my health care provider before I take this medicine? They need to know if you have any of these conditions: -bipolar disorder, depression, schizophrenia or other mental illness -heart disease -if you often drink alcohol -kidney disease -peripheral vascular disease -seizures -stroke -suicidal thoughts, plans, or attempt; a previous suicide attempt by you or a family member -an unusual or allergic reaction to varenicline, other medicines, foods, dyes, or preservatives -pregnant or trying to get pregnant -breast-feeding How should I use this medicine? Take this medicine by mouth after eating. Take with a full glass of water. Follow the directions on the prescription  label. Take your doses at regular intervals. Do not take your medicine more often than directed. There are 3 ways you can use this medicine to help you quit smoking; talk to your health care professional to decide which plan is right for you: 1) you can choose a quit date and start this medicine 1 week before the quit date, or, 2) you can start taking this medicine before you choose a quit date, and then pick a quit date between day 8 and 35 days of treatment, or, 3) if you are not sure that you are able or willing to quit smoking right  away, start taking this medicine and slowly decrease the amount you smoke as directed by your health care professional with the goal of being cigarette-free by week 12 of treatment. Stick to your plan; ask about support groups or other ways to help you remain cigarette-free. If you are motivated to quit smoking and did not succeed during a previous attempt with this medicine for reasons other than side effects, or if you returned to smoking after this treatment, speak with your health care professional about whether another course of this medicine may be right for you. A special MedGuide will be given to you by the pharmacist with each prescription and refill. Be sure to read this information carefully each time. Talk to your pediatrician regarding the use of this medicine in children. This medicine is not approved for use in children. Overdosage: If you think you have taken too much of this medicine contact a poison control center or emergency room at once. NOTE: This medicine is only for you. Do not share this medicine with others. What if I miss a dose? If you miss a dose, take it as soon as you can. If it is almost time for your next dose, take only that dose. Do not take double or extra doses. What may interact with this medicine? -alcohol or any product that contains alcohol -insulin -other stop smoking aids -theophylline -warfarin This list may not describe all possible interactions. Give your health care provider a list of all the medicines, herbs, non-prescription drugs, or dietary supplements you use. Also tell them if you smoke, drink alcohol, or use illegal drugs. Some items may interact with your medicine. What should I watch for while using this medicine? Visit your doctor or health care professional for regular check ups. Ask for ongoing advice and encouragement from your doctor or healthcare professional, friends, and family to help you quit. If you smoke while on this medication,  quit again Your mouth may get dry. Chewing sugarless gum or sucking hard candy, and drinking plenty of water may help. Contact your doctor if the problem does not go away or is severe. You may get drowsy or dizzy. Do not drive, use machinery, or do anything that needs mental alertness until you know how this medicine affects you. Do not stand or sit up quickly, especially if you are an older patient. This reduces the risk of dizzy or fainting spells. Sleepwalking can happen during treatment with this medicine, and can sometimes lead to behavior that is harmful to you, other people, or property. Stop taking this medicine and tell your doctor if you start sleepwalking or have other unusual sleep-related activity. Decrease the amount of alcoholic beverages that you drink during treatment with this medicine until you know if this medicine affects your ability to tolerate alcohol. Some people have experienced increased drunkenness (intoxication), unusual or sometimes aggressive behavior, or  no memory of things that have happened (amnesia) during treatment with this medicine. The use of this medicine may increase the chance of suicidal thoughts or actions. Pay special attention to how you are responding while on this medicine. Any worsening of mood, or thoughts of suicide or dying should be reported to your health care professional right away. What side effects may I notice from receiving this medicine? Side effects that you should report to your doctor or health care professional as soon as possible: -allergic reactions like skin rash, itching or hives, swelling of the face, lips, tongue, or throat -acting aggressive, being angry or violent, or acting on dangerous impulses -breathing problems -changes in vision -chest pain or chest tightness -confusion, trouble speaking or understanding -new or worsening depression, anxiety, or panic attacks -extreme increase in activity and talking (mania) -fast,  irregular heartbeat -feeling faint or lightheaded, falls -fever -pain in legs when walking -problems with balance, talking, walking -redness, blistering, peeling or loosening of the skin, including inside the mouth -ringing in ears -seeing or hearing things that aren't there (hallucinations) -seizures -sleepwalking -sudden numbness or weakness of the face, arm or leg -thoughts about suicide or dying, or attempts to commit suicide -trouble passing urine or change in the amount of urine -unusual bleeding or bruising -unusually weak or tired Side effects that usually do not require medical attention (report to your doctor or health care professional if they continue or are bothersome): -constipation -headache -nausea, vomiting -strange dreams -stomach gas -trouble sleeping This list may not describe all possible side effects. Call your doctor for medical advice about side effects. You may report side effects to FDA at 1-800-FDA-1088. Where should I keep my medicine? Keep out of the reach of children. Store at room temperature between 15 and 30 degrees C (59 and 86 degrees F). Throw away any unused medicine after the expiration date. NOTE: This sheet is a summary. It may not cover all possible information. If you have questions about this medicine, talk to your doctor, pharmacist, or health care provider.  2018 Elsevier/Gold Standard (2014-09-11 16:14:23)  Continue all medications as directed. Follow Chantix taper per pack instructions. Increase water intake, strive for at least 100 ounces/day.   Follow Mediterranean diet. Increase regular exercise.  Recommend at least 30 minutes daily, 5 days per week of walking, jogging, biking, swimming, YouTube/Pinterest workout videos. Recommend fasting lab appt in a few weeks and complete physical in 2-3 months. NICE TO SEE YOU!

## 2017-09-07 NOTE — Assessment & Plan Note (Signed)
Continue all medications as directed. Follow Chantix taper per pack instructions. Increase water intake, strive for at least 100 ounces/day.   Follow Mediterranean diet. Increase regular exercise.  Recommend at least 30 minutes daily, 5 days per week of walking, jogging, biking, swimming, YouTube/Pinterest workout videos. Recommend fasting lab appt in a few weeks and complete physical in 2-3 months.

## 2017-09-12 ENCOUNTER — Telehealth: Payer: Self-pay | Admitting: Adult Health

## 2017-09-12 NOTE — Telephone Encounter (Signed)
Wife called on behalf of patient and states patient was in last week for smoking cessation and was given a prescription for Chanix. At the pharmacy they were told that if we did a PA for them they could get this item for a $0 copay, please advise if we can do this and patient's wife would like to be notified either way.

## 2017-09-13 NOTE — Telephone Encounter (Signed)
Cover My Meds PA submitted.  Awaiting PA determination.  Tiajuana Amass, CMA

## 2017-09-14 NOTE — Telephone Encounter (Signed)
PA for Chantix approved.  Tiajuana Amass, CMA

## 2017-09-15 NOTE — Telephone Encounter (Signed)
Pt informed.  T. Nelson, CMA 

## 2017-11-29 ENCOUNTER — Other Ambulatory Visit: Payer: Self-pay

## 2017-12-08 NOTE — Progress Notes (Deleted)
Subjective:    Patient ID: Robert Quinn, male    DOB: 01-21-1980, 37 y.o.   MRN: [id number]  HPI: 09/07/17 OV:  Mr. Critzer is here for f/u: Tobacco Use and HLD He reports having his lipids drawn at [facility] and states "they told me that my cholesterol was really high". He also reports having eaten a meal high in saturated fat within an hour of the lab draw. He eats a diet high in CHO/sugar/saturated fat. He estimates to drink >8 soda pops/day He was on Wellbutrin for smoking cessation, however states "It didn't help me with the cigarettes". He has been off Wellbutrin >3 months He reports speaking with his psychiatrist at [facility] about using Chantix while he is on Olanzapine (Zyprexa) 20mg  QD, per pt "he says it is okay to use together" He denies thoughts of harming himself/others He reports a stable mood He is currently smoking pack and half/day, he has been using tobacco >17 years He denies ETOH or illicit drug use Lipid panel was ordered 08/2016- was never completed  12/11/17 OV: Mr. Mahl is here for CPE He reports medication compliance, denies SE He reports Fasting labs completed ??? Healthcare Maintenance: Immunizations-  Patient Care Team    Relationship Specialty Notifications Start End  Emelynn Rance, Jinny Blossom, NP PCP - General Family Medicine  08/16/16     Patient Active Problem List   Diagnosis Date Noted  . Aphakia of left eye 04/10/2017  . Epiretinal membrane (ERM) of left eye 04/10/2017  . HSV-2 (herpes simplex virus 2) infection 09/13/2016  . Intraocular foreign body of left eye 09/08/2016  . Ruptured globe of left eye 09/07/2016  . Exposure to sexually transmitted disease (STD) 08/24/2016  . Healthcare maintenance 08/24/2016  . Tobacco use disorder 08/24/2016  . Bipolar affective disorder (HCC) 08/24/2016     Past Medical History:  Diagnosis Date  . Chronic pain   . Hypoglycemia      Past Surgical History:  Procedure Laterality Date  . HERNIA  REPAIR    . INTRAOCULAR LENS INSERTION    . KNEE SURGERY    . RETINAL DETACHMENT SURGERY       Family History  Problem Relation Age of Onset  . Heart disease Father   . Heart attack Father   . Stroke Father   . Heart Problems Sister   . Healthy Brother   . Healthy Brother   . Healthy Brother      Social History   Substance and Sexual Activity  Drug Use No     Social History   Substance and Sexual Activity  Alcohol Use No     Social History   Tobacco Use  Smoking Status Current Every Day Smoker  . Packs/day: 1.00  . Years: 17.00  . Pack years: 17.00  . Types: Cigarettes  Smokeless Tobacco Never Used     Outpatient Encounter Medications as of 12/11/2017  Medication Sig  . OLANZapine (ZYPREXA) 20 MG tablet Take 20 mg by mouth daily.  . prednisoLONE acetate (PRED FORTE) 1 % ophthalmic suspension Place 1 drop into the left eye 4 (four) times daily.  . varenicline (CHANTIX PAK) 0.5 MG X 11 & 1 MG X 42 tablet Take one 0.5 mg tablet by mouth once daily for 3 days, then increase to one 0.5 mg tablet twice daily for 4 days, then increase to one 1 mg tablet twice daily.   No facility-administered encounter medications on file as of 12/11/2017.  Allergies: Patient has no known allergies.  There is no height or weight on file to calculate BMI.  There were no vitals taken for this visit.  Review of Systems  Constitutional: Positive for fatigue. Negative for activity change, appetite change, chills, diaphoresis, fever and unexpected weight change.  HENT: Negative for congestion.   Eyes: Positive for visual disturbance.  Respiratory: Negative for cough, chest tightness, shortness of breath, wheezing and stridor.   Cardiovascular: Negative for chest pain, palpitations and leg swelling.  Gastrointestinal: Negative for abdominal distention, abdominal pain, blood in stool, constipation, diarrhea, nausea and vomiting.  Genitourinary: Negative for difficulty urinating  and frequency.  Musculoskeletal: Negative for arthralgias.  Skin: Negative for color change, pallor, rash and wound.  Neurological: Negative for dizziness and headaches.  Hematological: Does not bruise/bleed easily.  Psychiatric/Behavioral: Negative for behavioral problems, confusion, decreased concentration, dysphoric mood, hallucinations, self-injury, sleep disturbance and suicidal ideas. The patient is not nervous/anxious and is not hyperactive.        Objective:   Physical Exam  Constitutional: He is oriented to person, place, and time. He appears well-developed and well-nourished. No distress.  HENT:  Head: Normocephalic and atraumatic.  Right Ear: External ear normal. No decreased hearing is noted.  Left Ear: External ear normal. No decreased hearing is noted.  Nose: Nose normal.  Mouth/Throat: Oropharynx is clear and moist. Abnormal dentition.  Eyes: Pupils are equal, round, and reactive to light. EOM are normal. Left conjunctiva is injected.  Cardiovascular: Normal rate, regular rhythm, normal heart sounds and intact distal pulses.  No murmur heard. Pulmonary/Chest: Effort normal and breath sounds normal. No stridor. No respiratory distress. He has no wheezes. He has no rales. He exhibits no tenderness.  Neurological: He is alert and oriented to person, place, and time.  Skin: Skin is warm and dry. Capillary refill takes less than 2 seconds. No rash noted. He is not diaphoretic. No erythema. No pallor.  Psychiatric: He has a normal mood and affect. His behavior is normal. Judgment and thought content normal.      Assessment & Plan:   No diagnosis found.  No problem-specific Assessment & Plan notes found for this encounter.  Pt was in the office today for 25+ minutes, I spent >75% of time in face to face counseling of patient's various medical conditions and in coordination of care  FOLLOW-UP:  No follow-ups on file.

## 2017-12-11 ENCOUNTER — Encounter: Payer: 59 | Admitting: Adult Health

## 2018-07-01 ENCOUNTER — Other Ambulatory Visit: Payer: Self-pay

## 2018-07-01 ENCOUNTER — Emergency Department (HOSPITAL_COMMUNITY): Payer: 59

## 2018-07-01 ENCOUNTER — Emergency Department (HOSPITAL_COMMUNITY)
Admission: EM | Admit: 2018-07-01 | Discharge: 2018-07-02 | Disposition: A | Discharge status: Home / Self Care | Payer: 59 | Attending: Emergency Medicine | Admitting: Emergency Medicine

## 2018-07-01 DIAGNOSIS — Z5321 Procedure and treatment not carried out due to patient leaving prior to being seen by health care provider: Secondary | ICD-10-CM | POA: Diagnosis not present

## 2018-07-01 DIAGNOSIS — R079 Chest pain, unspecified: Secondary | ICD-10-CM | POA: Insufficient documentation

## 2018-07-01 LAB — BASIC METABOLIC PANEL
Anion gap: 9 (ref 5–15)
BUN: 10 mg/dL (ref 6–20)
CO2: 23 mmol/L (ref 22–32)
Calcium: 9 mg/dL (ref 8.9–10.3)
Chloride: 105 mmol/L (ref 98–111)
Creatinine, Ser: 1 mg/dL (ref 0.61–1.24)
GFR calc Af Amer: >60 mL/min (ref >60)
GFR calc non Af Amer: >60 mL/min (ref >60)
Glucose, Bld: 89 mg/dL (ref 70–99)
Potassium: 3.8 mmol/L (ref 3.5–5.1)
Sodium: 137 mmol/L (ref 135–145)

## 2018-07-01 LAB — TROPONIN I: Troponin I: <0.03 ng/mL (ref <0.03)

## 2018-07-01 LAB — CBC
HCT: 45.5 % (ref 39.0–52.0)
Hemoglobin: 15.6 g/dL (ref 13.0–17.0)
MCH: 30.6 pg (ref 26.0–34.0)
MCHC: 34.3 g/dL (ref 30.0–36.0)
MCV: 89.2 fL (ref 80.0–100.0)
Platelets: 256 10*3/uL (ref 150–400)
RBC: 5.1 MIL/uL (ref 4.22–5.81)
RDW: 12.1 % (ref 11.5–15.5)
WBC: 11.3 10*3/uL — ABNORMAL HIGH (ref 4.0–10.5)
nRBC: 0 % (ref 0.0–0.2)

## 2018-07-01 NOTE — ED Triage Notes (Signed)
Pt c/o intermittent CP x1 month. Also c/o SOB.

## 2018-07-02 NOTE — ED Notes (Signed)
Called Patient to be room x3 and had no answer.

## 2018-07-12 ENCOUNTER — Emergency Department (HOSPITAL_COMMUNITY): Payer: 59

## 2018-07-12 ENCOUNTER — Other Ambulatory Visit: Payer: Self-pay

## 2018-07-12 ENCOUNTER — Emergency Department (HOSPITAL_COMMUNITY)
Admission: EM | Admit: 2018-07-12 | Discharge: 2018-07-12 | Disposition: A | Discharge status: Home / Self Care | Payer: 59 | Attending: Emergency Medicine | Admitting: Emergency Medicine

## 2018-07-12 ENCOUNTER — Encounter (HOSPITAL_COMMUNITY): Payer: Self-pay

## 2018-07-12 DIAGNOSIS — M25551 Pain in right hip: Secondary | ICD-10-CM | POA: Insufficient documentation

## 2018-07-12 DIAGNOSIS — F17210 Nicotine dependence, cigarettes, uncomplicated: Secondary | ICD-10-CM | POA: Insufficient documentation

## 2018-07-12 DIAGNOSIS — Y929 Unspecified place or not applicable: Secondary | ICD-10-CM | POA: Insufficient documentation

## 2018-07-12 DIAGNOSIS — S161XXA Strain of muscle, fascia and tendon at neck level, initial encounter: Secondary | ICD-10-CM | POA: Diagnosis not present

## 2018-07-12 DIAGNOSIS — R0789 Other chest pain: Secondary | ICD-10-CM | POA: Diagnosis not present

## 2018-07-12 DIAGNOSIS — M25561 Pain in right knee: Secondary | ICD-10-CM | POA: Diagnosis not present

## 2018-07-12 DIAGNOSIS — M25512 Pain in left shoulder: Secondary | ICD-10-CM | POA: Diagnosis not present

## 2018-07-12 DIAGNOSIS — R1030 Lower abdominal pain, unspecified: Secondary | ICD-10-CM | POA: Diagnosis not present

## 2018-07-12 DIAGNOSIS — M25562 Pain in left knee: Secondary | ICD-10-CM | POA: Insufficient documentation

## 2018-07-12 DIAGNOSIS — R51 Headache: Secondary | ICD-10-CM | POA: Insufficient documentation

## 2018-07-12 DIAGNOSIS — Y999 Unspecified external cause status: Secondary | ICD-10-CM | POA: Diagnosis not present

## 2018-07-12 DIAGNOSIS — S199XXA Unspecified injury of neck, initial encounter: Secondary | ICD-10-CM | POA: Diagnosis present

## 2018-07-12 DIAGNOSIS — M79602 Pain in left arm: Secondary | ICD-10-CM

## 2018-07-12 DIAGNOSIS — Z79899 Other long term (current) drug therapy: Secondary | ICD-10-CM | POA: Insufficient documentation

## 2018-07-12 DIAGNOSIS — Y939 Activity, unspecified: Secondary | ICD-10-CM | POA: Insufficient documentation

## 2018-07-12 LAB — CBC
HCT: 43.6 % (ref 39.0–52.0)
Hemoglobin: 14.8 g/dL (ref 13.0–17.0)
MCH: 30.5 pg (ref 26.0–34.0)
MCHC: 33.9 g/dL (ref 30.0–36.0)
MCV: 89.9 fL (ref 80.0–100.0)
Platelets: 208 10*3/uL (ref 150–400)
RBC: 4.85 MIL/uL (ref 4.22–5.81)
RDW: 12.2 % (ref 11.5–15.5)
WBC: 13.1 10*3/uL — ABNORMAL HIGH (ref 4.0–10.5)
nRBC: 0 % (ref 0.0–0.2)

## 2018-07-12 LAB — I-STAT CHEM 8, ED
BUN: 12 mg/dL (ref 6–20)
Calcium, Ion: 1.13 mmol/L — ABNORMAL LOW (ref 1.15–1.40)
Chloride: 106 mmol/L (ref 98–111)
Creatinine, Ser: 1 mg/dL (ref 0.61–1.24)
Glucose, Bld: 110 mg/dL — ABNORMAL HIGH (ref 70–99)
HCT: 43 % (ref 39.0–52.0)
Hemoglobin: 14.6 g/dL (ref 13.0–17.0)
Potassium: 3.5 mmol/L (ref 3.5–5.1)
Sodium: 139 mmol/L (ref 135–145)
TCO2: 25 mmol/L (ref 22–32)

## 2018-07-12 LAB — PROTIME-INR
INR: 1 (ref 0.8–1.2)
Prothrombin Time: 13.1 seconds (ref 11.4–15.2)

## 2018-07-12 LAB — COMPREHENSIVE METABOLIC PANEL
ALT: 24 U/L (ref 0–44)
AST: 25 U/L (ref 15–41)
Albumin: 3.5 g/dL (ref 3.5–5.0)
Alkaline Phosphatase: 85 U/L (ref 38–126)
Anion gap: 11 (ref 5–15)
BUN: 9 mg/dL (ref 6–20)
CO2: 21 mmol/L — ABNORMAL LOW (ref 22–32)
Calcium: 8.8 mg/dL — ABNORMAL LOW (ref 8.9–10.3)
Chloride: 106 mmol/L (ref 98–111)
Creatinine, Ser: 1.1 mg/dL (ref 0.61–1.24)
GFR calc Af Amer: >60 mL/min (ref >60)
GFR calc non Af Amer: >60 mL/min (ref >60)
Glucose, Bld: 113 mg/dL — ABNORMAL HIGH (ref 70–99)
Potassium: 3.6 mmol/L (ref 3.5–5.1)
Sodium: 138 mmol/L (ref 135–145)
Total Bilirubin: 0.7 mg/dL (ref 0.3–1.2)
Total Protein: 6.3 g/dL — ABNORMAL LOW (ref 6.5–8.1)

## 2018-07-12 LAB — LACTIC ACID, PLASMA: Lactic Acid, Venous: 1 mmol/L (ref 0.5–1.9)

## 2018-07-12 LAB — CDS SEROLOGY

## 2018-07-12 MED ORDER — MORPHINE SULFATE (PF) 4 MG/ML IV SOLN
4.0000 mg | Freq: Once | INTRAVENOUS | Status: AC
  Administered 2018-07-12: 4 mg via INTRAVENOUS
  Filled 2018-07-12: qty 1

## 2018-07-12 MED ORDER — IOHEXOL 300 MG/ML  SOLN
100.0000 mL | Freq: Once | INTRAMUSCULAR | Status: AC | PRN
  Administered 2018-07-12: 100 mL via INTRAVENOUS

## 2018-07-12 MED ORDER — METHOCARBAMOL 500 MG PO TABS: 500.0000 mg | ORAL_TABLET | Freq: Two times a day (BID) | ORAL | 0 refills | Status: AC

## 2018-07-12 NOTE — ED Notes (Signed)
Patient transported to X-ray 
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
## 2018-07-12 NOTE — Discharge Instructions (Signed)
Take NSAIDs or Tylenol as needed for the next week. Take this medicine with food. °Take muscle relaxer at bedtime to help you sleep. This medicine makes you drowsy so do not take before driving or work °Use a heating pad for sore muscles - use for 20 minutes several times a day °Return for worsening symptoms ° °

## 2018-07-12 NOTE — ED Notes (Signed)
Pt states right hip pain when he lifts right leg and left arm pain, esp when lifting arm.
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
## 2018-07-18 ENCOUNTER — Other Ambulatory Visit: Payer: Self-pay

## 2018-07-18 ENCOUNTER — Encounter: Payer: Self-pay | Admitting: Adult Health

## 2018-07-18 ENCOUNTER — Ambulatory Visit (INDEPENDENT_AMBULATORY_CARE_PROVIDER_SITE_OTHER): Payer: 59 | Admitting: Adult Health

## 2018-07-18 VITALS — BP 111/69 | HR 75 | Temp 98.3°F | Ht 68.5 in | Wt 202.7 lb

## 2018-07-18 DIAGNOSIS — Z0000 Encounter for general adult medical examination without abnormal findings: Secondary | ICD-10-CM

## 2018-07-18 DIAGNOSIS — R079 Chest pain, unspecified: Secondary | ICD-10-CM | POA: Diagnosis not present

## 2018-07-18 NOTE — Assessment & Plan Note (Signed)
Please schedule complete physical in 3 months, fasting labs the week prior. Continue to social distance and wear a mask when out in public.

## 2018-07-18 NOTE — Patient Instructions (Addendum)
Contusion A contusion is a deep bruise. Contusions are the result of a blunt injury to tissues and muscle fibers under the skin. The injury causes bleeding under the skin. The skin overlying the contusion may turn blue, purple, or yellow. Minor injuries will give you a painless contusion, but more severe injuries cause contusions that may stay painful and swollen for a few weeks. Follow these instructions at home: Pay attention to any changes in your symptoms. Let your health care provider know about them. Take these actions to relieve your pain. Managing pain, stiffness, and swelling   Use resting, icing, applying pressure (compression), and raising (elevating) the injured area. This is often called the RICE strategy. ? Rest the injured area. Return to your normal activities as told by your health care provider. Ask your health care provider what activities are safe for you. ? If directed, put ice on the injured area:  Put ice in a plastic bag.  Place a towel between your skin and the bag.  Leave the ice on for 20 minutes, 2-3 times per day. ? If directed, apply light compression to the injured area using an elastic bandage. Make sure the bandage is not wrapped too tightly. Remove and reapply the bandage as directed by your health care provider. ? If possible, raise (elevate) the injured area above the level of your heart while you are sitting or lying down. General instructions  Take over-the-counter and prescription medicines only as told by your health care provider.  Keep all follow-up visits as told by your health care provider. This is important. Contact a health care provider if:  Your symptoms do not improve after several days of treatment.  Your symptoms get worse.  You have difficulty moving the injured area. Get help right away if:  You have severe pain.  You have numbness in a hand or foot.  Your hand or foot turns pale or cold. Summary  A contusion is a deep  bruise.  Contusions are the result of a blunt injury to tissues and muscle fibers under the skin.  It is treated with rest, ice, compression, and elevation. You may be given over-the-counter medicines for pain.  Contact a health care provider if your symptoms do not improve, or get worse.  Get help right away if you have severe pain, have numbness, or the area turns pale or cold. This information is not intended to replace advice given to you by your health care provider. Make sure you discuss any questions you have with your health care provider. Document Released: 10/06/2004 Document Revised: 08/17/2017 Document Reviewed: 08/17/2017 Elsevier Patient Education  2020 Elsevier Inc.   Managing Post-Traumatic Stress Disorder If you have been diagnosed with post-traumatic stress disorder (PTSD), you may be relieved that you now know why you have felt or behaved a certain way. Still, you may feel overwhelmed about the treatment ahead. You may also wonder how to get the support you need and how to deal with the condition day-to-day. If you are living with PTSD, there are ways to help you recover from it and manage your symptoms. How to manage lifestyle changes Managing stress Stress is your body's reaction to life changes and events, both good and bad. Stress can make PTSD worse. Take the following steps to manage stress:  Talk with your health care provider or a counselor if you would like to learn more about techniques to reduce your stress. He or she may suggest some stress reduction techniques such as: ?  Muscle relaxation exercises. ? Regular exercise. ? Meditation, yoga, or other mind-body exercises. ? Breathing exercises. ? Listening to quiet music. ? Spending time outside.  Maintain a healthy lifestyle. Eat a healthy diet, exercise regularly, get plenty of sleep, and take time to relax.  Spend time with others. Talk with them about how you are feeling and what kind of support you  need. Try not to isolate yourself, even though you may feel like doing that. Isolating yourself can delay your recovery.  Do activities and hobbies that you enjoy.  Pace yourself when doing stressful things. Take breaks, and reward yourself when you finish. Make sure that you do not overload your schedule.  Medicines Your health care provider may suggest certain medicines if he or she feels that they will help to improve your condition. Medicines for depression (antidepressants) or severe loss of contact with reality (antipsychotics) may be used to treat PTSD. Avoid using alcohol and other substances that may prevent your medicines from working properly. It is also important to:  Talk with your pharmacist or health care provider about all medicines that you take, their possible side effects, and which medicines are safe to take together.  Make it your goal to take part in all treatment decisions (shared decision-making). Ask about possible side effects of medicines that your health care provider recommends, and tell him or her how you feel about having those side effects. It is best if shared decision-making with your health care provider is part of your total treatment plan. If your health care provider prescribes a medicine, you may not notice the full benefits of it for 4-8 weeks. Most people who are treated for PTSD need to take medicine for at least 6-12 months after they feel better. If you are taking medicines as part of your treatment, do not stop taking medicines before you ask your health care provider if it is safe to stop. You may need to have the medicine slowly decreased (tapered) over time to lower the risk of harmful side effects. Relationships Many people who have PTSD have difficulty trusting others. Make an effort to:  Take risks and develop trust with close friends and family members. Developing trust in others can help you feel safe and connect you with emotional support.  Be  open and honest about your feelings.  Have fun and relax in safe spaces, such as with friends and family.  Think about going to couples counseling, family education classes, or family therapy. Your loved ones may not always know how to be supportive. Therapy can be helpful for everyone. How to recognize changes in your condition Be aware of your symptoms and how often you have them. The following symptoms mean that you need to seek help for your PTSD:  You feel suspicious and angry.  You have repeated flashbacks.  You avoid going out or being with others.  You have an increasing number of fights with close friends or family members, such as your spouse.  You have thoughts about hurting yourself or others.  You cannot get relief from feelings of depression or anxiety. Follow these instructions at home: Lifestyle  Exercise regularly. Try to do 30 or more minutes of physical activity on most days of the week.  Try to get 7-9 hours of sleep each night. To help with sleep: ? Keep your bedroom cool and dark. ? Avoid screen time before bedtime. This means avoiding use of your TV, computer, tablet, and cell phone.  Practice self-soothing  skills and use them daily.  Try to have fun and seek humor in your life. Eating and drinking  Do not eat a heavy meal during the hour before you go to bed.  Do not drink alcohol or caffeinated drinks before bed.  Avoid using alcohol or drugs. General instructions  If your PTSD is affecting your marriage or family, seek help from a family therapist.  Take over-the-counter and prescription medicines only as told by your health care provider.  Make sure to let all of your health care providers know that you have PTSD. This is especially important if you are having surgery or need to be admitted to the hospital.  Keep all follow-up visits as told by your health care providers. This is important. Where to find support Talking to others  Explain  that PTSD is a mental health problem. It is something that a person can develop after experiencing or seeing a life-threatening event. Tell them that PTSD makes you feel stress like you did during the event.  Talk to your loved ones about the symptoms you have. Also tell them what things or situations can cause symptoms to start (are triggers for you).  Assure your loved ones that there are treatments to help PTSD. Discuss possibly seeking family therapy or couples therapy.  If you are worried or fearful about seeking treatment, ask for support.  Keep daily contact with at least one trusted friend or family member. Finances Not all insurance plans cover mental health care, so it is important to check with your insurance carrier. If paying for co-pays or counseling services is a problem, search for a local or county mental health care center. Public mental health care services may be offered there at a low cost or no cost when you are not able to see a private health care provider. If you are a veteran, contact a local veterans organization or veterans hospital for more information. If you are taking medicine for PTSD, you may be able to get the genericform, which may be less expensive than brand-name medicine. Some makers of prescription medicines also offer help to patients who cannot afford the medicines that they need. Therapy and support groups  Find a support group in your community. Often, groups are available for [employer] veterans, trauma victims, and family members or caregivers.  Look into volunteer opportunities. Taking part in these can help you feel more connected to your community.  Contact a local organization to find out if you are eligible for a service dog. Where to find more information Go to this website to find more information about PTSD, treatment of PTSD, and how to get support:  [facility] for PTSD: www.ptsd.[employer]ball[employer].cz Contact a health care provider if:  Your  symptoms get worse or do not get better. Get help right away if:  You have thoughts about hurting yourself or others. If you ever feel like you may hurt yourself or others, or have thoughts about taking your own life, get help right away. You can go to your nearest emergency department or call:  Your local emergency services (911 in the [place]  A suicide crisis helpline, such as the [place] at 712-405-2320. This is open 24-hours a day. Summary  If you are living with PTSD, there are ways to help you recover from it and manage your symptoms.  Find supportive environments and people who understand PTSD. Spend time in those places, and maintain contact with those people.  Work with your  health care team to create a plan for managing PTSD. The plan should include counseling, stress reduction techniques, and healthy lifestyle habits. This information is not intended to replace advice given to you by your health care provider. Make sure you discuss any questions you have with your health care provider. Document Released: 04/28/2016 Document Revised: 04/20/2018 Document Reviewed: 04/28/2016 Elsevier Patient Education  2020 Elsevier Inc.  Follow care instructions as directed above. If your Left knee hurts after 6 weeks, please call clinic for referral to Physical Therapy. Left Knee Xray from ED- FINDINGS: No evidence of fracture, dislocation, or joint effusion. No evidence of arthropathy or other focal bone abnormality. Soft tissues are unremarkable. IMPRESSION: Negative.- which means no acute injury. [facility], I just called the clinic-please contact them for follow up and to address new onset PTSD [phone] [address]  [place] [id number] Remain well hydrated, follow heart healthy diet. Continue to wean off tobacco- you can do it! Increase activity as tolerated. EKG- norma;. Thoughts and prayers for Robert Quinn during her recovery. Please  schedule complete physical in 3 months, fasting labs the week prior. Continue to social distance and wear a mask when out in public. FEEL BETTER!

## 2018-07-18 NOTE — Assessment & Plan Note (Addendum)
Follow care instructions as directed above. If your Left knee hurts after 6 weeks, please call clinic for referral to Physical Therapy. Left Knee Xray from ED- FINDINGS: No evidence of fracture, dislocation, or joint effusion. No evidence of arthropathy or other focal bone abnormality. Soft tissues are unremarkable. IMPRESSION: Negative.- which means no acute injury. [facility] is open, I just called the clinic-please contact them for follow up and to address new onset PTSD [phone] [address]  [place] [address] Remain well hydrated, follow heart healthy diet. Continue to wean off tobacco- you can do it! Increase activity as tolerated. EKG- norma;. Thoughts and prayers for Crystal during her recovery.
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
